# Patient Record
Sex: Female | Born: 1980 | Race: White | Hispanic: No | Smoking: Current some day smoker
Health system: Southern US, Community
[De-identification: ages and names within clinical notes are randomized; demographics above are authoritative.]

## PROBLEM LIST (undated history)

## (undated) DIAGNOSIS — F32A Depression, unspecified: Secondary | ICD-10-CM

## (undated) DIAGNOSIS — G8929 Other chronic pain: Secondary | ICD-10-CM

## (undated) DIAGNOSIS — F329 Major depressive disorder, single episode, unspecified: Secondary | ICD-10-CM

## (undated) DIAGNOSIS — M549 Dorsalgia, unspecified: Secondary | ICD-10-CM

## (undated) DIAGNOSIS — G43909 Migraine, unspecified, not intractable, without status migrainosus: Secondary | ICD-10-CM

## (undated) HISTORY — PX: TUBAL LIGATION: SHX77

## (undated) HISTORY — PX: OTHER SURGICAL HISTORY: SHX169

---

## 2005-08-13 ENCOUNTER — Emergency Department (HOSPITAL_COMMUNITY): Admission: EM | Admit: 2005-08-13 | Discharge: 2005-08-13 | Payer: Self-pay | Admitting: Emergency Medicine

## 2005-09-08 ENCOUNTER — Emergency Department (HOSPITAL_COMMUNITY): Admission: EM | Admit: 2005-09-08 | Discharge: 2005-09-08 | Payer: Self-pay | Admitting: Emergency Medicine

## 2006-10-11 ENCOUNTER — Emergency Department (HOSPITAL_COMMUNITY): Admission: EM | Admit: 2006-10-11 | Discharge: 2006-10-11 | Payer: Self-pay | Admitting: Emergency Medicine

## 2015-12-27 ENCOUNTER — Emergency Department (HOSPITAL_COMMUNITY)
Admission: EM | Admit: 2015-12-27 | Discharge: 2015-12-27 | Disposition: A | Discharge status: Home / Self Care | Payer: BLUE CROSS/BLUE SHIELD | Attending: Emergency Medicine | Admitting: Emergency Medicine

## 2015-12-27 ENCOUNTER — Encounter (HOSPITAL_COMMUNITY): Payer: Self-pay | Admitting: Emergency Medicine

## 2015-12-27 DIAGNOSIS — Z79899 Other long term (current) drug therapy: Secondary | ICD-10-CM | POA: Insufficient documentation

## 2015-12-27 DIAGNOSIS — F17200 Nicotine dependence, unspecified, uncomplicated: Secondary | ICD-10-CM | POA: Diagnosis not present

## 2015-12-27 DIAGNOSIS — T7840XA Allergy, unspecified, initial encounter: Secondary | ICD-10-CM

## 2015-12-27 MED ORDER — PREDNISONE 10 MG PO TABS
10.0000 mg | ORAL_TABLET | Freq: Once | ORAL | Status: AC
  Administered 2015-12-27: 10 mg via ORAL
  Filled 2015-12-27: qty 1

## 2015-12-27 MED ORDER — FAMOTIDINE 20 MG PO TABS
20.0000 mg | ORAL_TABLET | Freq: Once | ORAL | Status: AC
  Administered 2015-12-27: 20 mg via ORAL
  Filled 2015-12-27: qty 1

## 2015-12-27 MED ORDER — HYDROXYZINE HCL 25 MG PO TABS: 25.0000 mg | ORAL_TABLET | Freq: Four times a day (QID) | ORAL | Status: DC | PRN

## 2015-12-27 MED ORDER — EPINEPHRINE 0.3 MG/0.3ML IJ SOAJ: 0.3000 mg | Freq: Once | INTRAMUSCULAR | Status: DC | PRN

## 2015-12-27 MED ORDER — PREDNISONE 10 MG (21) PO TBPK: 10.0000 mg | ORAL_TABLET | Freq: Every day | ORAL | Status: DC

## 2015-12-27 NOTE — ED Notes (Signed)
Pt states she was dx with allergic reaction at [facility] on Sunday. States she has been taking old prednisone prescription from home and has been unable to get new prescription filled. Pt states today she feels face and throat are swollen and generalized itching.

## 2015-12-27 NOTE — ED Provider Notes (Signed)
CSN: [id number]     Arrival date & time 12/27/15  1056 History   First MD Initiated Contact with Patient 12/27/15 1118     Chief Complaint  Patient presents with  . Allergic Reaction     (Consider location/radiation/quality/duration/timing/severity/associated sxs/prior Treatment) HPI  Renee Hunter Is a 35 year old female who presents the emergency department, chief complaint of allergic reaction. Patient states that on Sunday, 3 days ago she had an allergic reaction that began as itching on her hands and progressed to her face and neck. She had used a mentholated Aspercreme that she always uses prior to the event. She's never had an allergic reaction to it before. Patient states that she has been taking 40 mg of prednisone daily. Last night before bed. She used to be Aspercreme again. She awoke this morning with swelling to the right side of her lower lip, itching and swelling on the posterior neck and forearms where she had applied the medication. She took 140 mg tablet of prednisone and 25 mg of Benadryl. She is with a friend who drove her here. He states that she did have some wheezing and was tearful prior to arrival. She states that she still feels some chest tightness but denies any wheezing and feels that she is improving after medications prior to arrival. Her itching is decreasing. She denies tongue swelling, throat swelling. 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 History reviewed. No pertinent past medical history. Past Surgical History  Procedure Laterality Date  . Cesarean section    . Tubal ligation    . Wisdom teeth     No family history on file. Social History  Substance Use Topics  . Smoking status: Current Some Day Smoker  . Smokeless tobacco: None  . Alcohol Use: No   OB History    No data available     Review of Systems  Ten systems reviewed and are negative for acute change, except as noted in the HPI.    Allergies  Mushroom  extract complex and Penicillins  Home Medications   Prior to Admission medications   Medication Sig Start Date End Date Taking? Authorizing Provider  diphenhydrAMINE (BENADRYL) 25 mg capsule Take 25 mg by mouth every 6 (six) hours as needed for itching.   Yes Historical Provider, MD  predniSONE (DELTASONE) 20 MG tablet Take 40 mg by mouth 2 (two) times daily with a meal.   Yes Historical Provider, MD   BP 130/95 mmHg  Pulse 103  Temp(Src) 97.7 F (36.5 C) (Oral)  Resp 20  Wt 134.718 kg  SpO2 99%  LMP 11/18/2015 Physical Exam  Constitutional: She is oriented to person, place, and time. She appears well-developed and well-nourished. No distress.  HENT:  Head: Normocephalic and atraumatic.  Swelling on the right lower lip, no sob, lingual or tongue swelling. Oropharynx clear and moist.   Eyes: Conjunctivae are normal. No scleral icterus.  Neck: Normal range of motion.  No stridor  Cardiovascular: Normal rate, regular rhythm and normal heart sounds.  Exam reveals no gallop and no friction rub.   No murmur heard. Pulmonary/Chest: Effort normal and breath sounds normal. No respiratory distress. She has no wheezes.  Abdominal: Soft. Bowel sounds are normal. She exhibits no distension and no mass. There is no tenderness. There is no guarding.  Neurological: She is alert and oriented to person, place, and time.  Skin: Skin is warm and dry. She is not diaphoretic.  Nursing note and vitals  reviewed.   ED Course  Procedures (including critical care time) Labs Review Labs Reviewed - No data to display  Imaging Review No results found. I have personally reviewed and evaluated these images and lab results as part of my medical decision-making.   EKG Interpretation None      MDM   Final diagnoses:  Allergic reaction, initial encounter    Patient re-evaluated prior to dc, is hemodynamically stable, in no respiratory distress, and denies the feeling of throat closing. Pt has been  advised to take OTC benadryl & return to the ED if they have a mod-severe allergic rxn (s/s including throat closing, difficulty breathing, swelling of lips face or tongue).  Given epi pen and prednisone dosepack.Pt is to follow up with their PCP. Pt is agreeable with plan & verbalizes understanding.     Arthor Captain, PA-C 12/27/15 1705  Zadie Rhine, MD 12/28/15 1259

## 2015-12-27 NOTE — Discharge Instructions (Signed)
Epinephrine Injection Epinephrine is a medicine given by injection to temporarily treat an emergency allergic reaction. It is also used to treat severe asthmatic attacks and other lung problems. The medicine helps to enlarge (dilate) the small breathing tubes of the lungs. A life-threatening, sudden allergic reaction that involves the whole body is called anaphylaxis. Because of potential side effects, epinephrine should only be used as directed by your caregiver. RISKS AND COMPLICATIONS Possible side effects of epinephrine injections include:  Chest pain.  Irregular or rapid heartbeat.  Shortness of breath.  Nausea.  Vomiting.  Abdominal pain or cramping.  Sweating.  Dizziness.  Weakness.  Headache.  Nervousness. Report all side effects to your caregiver. HOW TO GIVE AN EPINEPHRINE INJECTION Give the epinephrine injection immediately when symptoms of a severe reaction begin. Inject the medicine into the outer thigh or any available, large muscle. Your caregiver can teach you how to do this. You do not need to remove any clothing. After the injection, call your local emergency services (911 in [place] Even if you improve after the injection, you need to be examined at a hospital emergency department. Epinephrine works quickly, but it also wears off quickly. Delayed reactions can occur. A delayed reaction may be as serious and dangerous as the initial reaction. HOME CARE INSTRUCTIONS  Make sure you and your family know how to give an epinephrine injection.  Use epinephrine injections as directed by your caregiver. Do not use this medicine more often or in larger doses than prescribed.  Always carry your epinephrine injection or anaphylaxis kit with you. This can be lifesaving if you have a severe reaction.  Store the medicine in a cool, dry place. If the medicine becomes discolored or cloudy, dispose of it properly and replace it with new medicine.  Check the expiration date on  your medicine. It may be unsafe to use medicines past their expiration date.  Tell your caregiver about any other medicines you are taking. Some medicines can react badly with epinephrine.  Tell your caregiver about any medical conditions you have, such as diabetes, high blood pressure (hypertension), heart disease, irregular heartbeats, or if you are pregnant. SEEK IMMEDIATE MEDICAL CARE IF:  You have used an epinephrine injection. Call your local emergency services (911 in [place] Even if you improve after the injection, you need to be examined at a hospital emergency department to make sure your allergic reaction is under control. You will also be monitored for adverse effects from the medicine.  You have chest pain.  You have irregular or fast heartbeats.  You have shortness of breath.  You have severe headaches.  You have severe nausea, vomiting, or abdominal cramps.  You have severe pain, swelling, or redness in the area where you gave the injection.   This information is not intended to replace advice given to you by your health care provider. Make sure you discuss any questions you have with your health care provider.   Document Released: 07/19/2000 Document Revised: 10/14/2011 Document Reviewed: 02/08/2015 Elsevier Interactive Patient Education 2016 [place]. Anaphylactic Reaction An anaphylactic reaction is a sudden, severe allergic reaction that involves the whole body. It can be life threatening. A hospital stay is often required. People with asthma, eczema, or hay fever are slightly more likely to have an anaphylactic reaction. CAUSES  An anaphylactic reaction may be caused by anything to which you are allergic. After being exposed to the allergic substance, your immune system becomes sensitized to it. When you are exposed to  that allergic substance again, an allergic reaction can occur. Common causes of an anaphylactic reaction include:  Medicines.  Foods, especially  peanuts, wheat, shellfish, milk, and eggs.  Insect bites or stings.  Blood products.  Chemicals, such as dyes, latex, and contrast material used for imaging tests. SYMPTOMS  When an allergic reaction occurs, the body releases histamine and other substances. These substances cause symptoms such as tightening of the airway. Symptoms often develop within seconds or minutes of exposure. Symptoms may include:  Skin rash or hives.  Itching.  Chest tightness.  Swelling of the eyes, tongue, or lips.  Trouble breathing or swallowing.  Lightheadedness or fainting.  Anxiety or confusion.  Stomach pains, vomiting, or diarrhea.  Nasal congestion.  A fast or irregular heartbeat (palpitations). DIAGNOSIS  Diagnosis is based on your history of recent exposure to allergic substances, your symptoms, and a physical exam. Your caregiver may also perform blood or urine tests to confirm the diagnosis. TREATMENT  Epinephrine medicine is the main treatment for an anaphylactic reaction. Other medicines that may be used for treatment include antihistamines, steroids, and albuterol. In severe cases, fluids and medicine to support blood pressure may be given through an intravenous line (IV). Even if you improve after treatment, you need to be observed to make sure your condition does not get worse. This may require a stay in the hospital. [place] a medical alert bracelet or necklace stating your allergy.  You and your family must learn how to use an anaphylaxis kit or give an epinephrine injection to temporarily treat an emergency allergic reaction. Always carry your epinephrine injection or anaphylaxis kit with you. This can be lifesaving if you have a severe reaction.  Do not drive or perform tasks after treatment until the medicines used to treat your reaction have worn off, or until your caregiver says it is okay.  If you have hives or a rash:  Take medicines as directed by  your caregiver.  You may use an over-the-counter antihistamine (diphenhydramine) as needed.  Apply cold compresses to the skin or take baths in cool water. Avoid hot baths or showers. SEEK MEDICAL CARE IF:   You develop symptoms of an allergic reaction to a new substance. Symptoms may start right away or minutes later.  You develop a rash, hives, or itching.  You develop new symptoms. SEEK IMMEDIATE MEDICAL CARE IF:   You have swelling of the mouth, difficulty breathing, or wheezing.  You have a tight feeling in your chest or throat.  You develop hives, swelling, or itching all over your body.  You develop severe vomiting or diarrhea.  You feel faint or pass out. This is an emergency. Use your epinephrine injection or anaphylaxis kit as you have been instructed. Call your local emergency services (911 in [place] Even if you improve after the injection, you need to be examined at a hospital emergency department. MAKE SURE YOU:   Understand these instructions.  Will watch your condition.  Will get help right away if you are not doing well or get worse.   This information is not intended to replace advice given to you by your health care provider. Make sure you discuss any questions you have with your health care provider.   Document Released: 07/22/2005 Document Revised: 07/27/2013 Document Reviewed: 02/01/2015 Elsevier Interactive Patient Education 2016 [place] Dermatitis Dermatitis is redness, soreness, and swelling (inflammation) of the skin. Contact dermatitis is a reaction to certain substances that touch  the skin. There are two types of contact dermatitis:   Irritant contact dermatitis. This type is caused by something that irritates your skin, such as dry hands from washing them too much. This type does not require previous exposure to the substance for a reaction to occur. This type is more common.  Allergic contact dermatitis. This type is caused by a  substance that you are allergic to, such as a nickel allergy or poison ivy. This type only occurs if you have been exposed to the substance (allergen) before. Upon a repeat exposure, your body reacts to the substance. This type is less common. CAUSES  Many different substances can cause contact dermatitis. Irritant contact dermatitis is most commonly caused by exposure to:   Makeup.   Soaps.   Detergents.   Bleaches.   Acids.   Metal salts, such as nickel.  Allergic contact dermatitis is most commonly caused by exposure to:   Poisonous plants.   Chemicals.   Jewelry.   Latex.   Medicines.   Preservatives in products, such as clothing.  RISK FACTORS This condition is more likely to develop in:   People who have jobs that expose them to irritants or allergens.  People who have certain medical conditions, such as asthma or eczema.  SYMPTOMS  Symptoms of this condition may occur anywhere on your body where the irritant has touched you or is touched by you. Symptoms include:  Dryness or flaking.   Redness.   Cracks.   Itching.   Pain or a burning feeling.   Blisters.  Drainage of small amounts of blood or clear fluid from skin cracks. With allergic contact dermatitis, there may also be swelling in areas such as the eyelids, mouth, or genitals.  DIAGNOSIS  This condition is diagnosed with a medical history and physical exam. A patch skin test may be performed to help determine the cause. If the condition is related to your job, you may need to see an occupational medicine specialist. TREATMENT Treatment for this condition includes figuring out what caused the reaction and protecting your skin from further contact. Treatment may also include:   Steroid creams or ointments. Oral steroid medicines may be needed in more severe cases.  Antibiotics or antibacterial ointments, if a skin infection is present.  Antihistamine lotion or an antihistamine  taken by mouth to ease itching.  A bandage (dressing). HOME CARE INSTRUCTIONS Skin Care  Moisturize your skin as needed.   Apply cool compresses to the affected areas.  Try taking a bath with:  Epsom salts. Follow the instructions on the packaging. You can get these at your local pharmacy or grocery store.  Baking soda. Pour a small amount into the bath as directed by your health care provider.  Colloidal oatmeal. Follow the instructions on the packaging. You can get this at your local pharmacy or grocery store.  Try applying baking soda paste to your skin. Stir water into baking soda until it reaches a paste-like consistency.  Do not scratch your skin.  Bathe less frequently, such as every other day.  Bathe in lukewarm water. Avoid using hot water. Medicines  Take or apply over-the-counter and prescription medicines only as told by your health care provider.   If you were prescribed an antibiotic medicine, take or apply your antibiotic as told by your health care provider. Do not stop using the antibiotic even if your condition starts to improve. General Instructions  Keep all follow-up visits as told by your health care  provider. This is important.  Avoid the substance that caused your reaction. If you do not know what caused it, keep a journal to try to track what caused it. Write down:  What you eat.  What cosmetic products you use.  What you drink.  What you wear in the affected area. This includes jewelry.  If you were given a dressing, take care of it as told by your health care provider. This includes when to change and remove it. SEEK MEDICAL CARE IF:   Your condition does not improve with treatment.  Your condition gets worse.  You have signs of infection such as swelling, tenderness, redness, soreness, or warmth in the affected area.  You have a fever.  You have new symptoms. SEEK IMMEDIATE MEDICAL CARE IF:   You have a severe headache, neck  pain, or neck stiffness.  You vomit.  You feel very sleepy.  You notice red streaks coming from the affected area.  Your bone or joint underneath the affected area becomes painful after the skin has healed.  The affected area turns darker.  You have difficulty breathing.   This information is not intended to replace advice given to you by your health care provider. Make sure you discuss any questions you have with your health care provider.   Document Released: 07/19/2000 Document Revised: 04/12/2015 Document Reviewed: 12/07/2014 Elsevier Interactive Patient Education [employer].

## 2016-12-01 ENCOUNTER — Encounter (HOSPITAL_COMMUNITY): Payer: Self-pay | Admitting: Emergency Medicine

## 2016-12-01 ENCOUNTER — Emergency Department (HOSPITAL_COMMUNITY)
Admission: EM | Admit: 2016-12-01 | Discharge: 2016-12-01 | Disposition: A | Discharge status: Home / Self Care | Payer: BLUE CROSS/BLUE SHIELD | Attending: Emergency Medicine | Admitting: Emergency Medicine

## 2016-12-01 DIAGNOSIS — R112 Nausea with vomiting, unspecified: Secondary | ICD-10-CM | POA: Insufficient documentation

## 2016-12-01 DIAGNOSIS — Z79899 Other long term (current) drug therapy: Secondary | ICD-10-CM | POA: Diagnosis not present

## 2016-12-01 DIAGNOSIS — R51 Headache: Secondary | ICD-10-CM | POA: Insufficient documentation

## 2016-12-01 DIAGNOSIS — Z7982 Long term (current) use of aspirin: Secondary | ICD-10-CM | POA: Diagnosis not present

## 2016-12-01 DIAGNOSIS — F17200 Nicotine dependence, unspecified, uncomplicated: Secondary | ICD-10-CM | POA: Diagnosis not present

## 2016-12-01 DIAGNOSIS — R519 Headache, unspecified: Secondary | ICD-10-CM

## 2016-12-01 HISTORY — DX: Migraine, unspecified, not intractable, without status migrainosus: G43.909

## 2016-12-01 HISTORY — DX: Other chronic pain: G89.29

## 2016-12-01 HISTORY — DX: Dorsalgia, unspecified: M54.9

## 2016-12-01 MED ORDER — SODIUM CHLORIDE 0.9 % IV BOLUS (SEPSIS)
500.0000 mL | Freq: Once | INTRAVENOUS | Status: AC
  Administered 2016-12-01: 500 mL via INTRAVENOUS

## 2016-12-01 MED ORDER — PROMETHAZINE HCL 25 MG/ML IJ SOLN
25.0000 mg | Freq: Once | INTRAMUSCULAR | Status: AC
  Administered 2016-12-01: 25 mg via INTRAVENOUS
  Filled 2016-12-01: qty 1

## 2016-12-01 MED ORDER — KETOROLAC TROMETHAMINE 30 MG/ML IJ SOLN
30.0000 mg | Freq: Once | INTRAMUSCULAR | Status: AC
  Administered 2016-12-01: 30 mg via INTRAVENOUS
  Filled 2016-12-01: qty 1

## 2016-12-01 NOTE — ED Triage Notes (Signed)
Head and neck pain x 1230 today. Hx of migraines

## 2016-12-01 NOTE — ED Provider Notes (Signed)
[facility]PT Provider Note   CSN: [id number] Arrival date & time: 12/01/16  1931     History   Chief Complaint Chief Complaint  Patient presents with  . Migraine    HPI Renee Hunter is a 36 y.o. female.  HPI Patient presents with headache. Began around 12:30 today. It is dull and throbbing. It is in the back of her head and also goes to the front of her head. Comes up her neck. Like previous migraines that she's had. States this is just a little more severe. No trauma. No numbness weakness. States the light bothers her. States the sound is also tough. States the bumps in the road hurt her also. No numbness weakness. No confusion. No fevers. States she does have some mild pain in her right ear. Denies possibly pregnancy since she is on her period and has had her tubes tied.   Past Medical History:  Diagnosis Date  . Chronic back pain   . Migraines     There are no active problems to display for this patient.   Past Surgical History:  Procedure Laterality Date  . CESAREAN SECTION    . TUBAL LIGATION    . wisdom teeth      OB History    No data available       Home Medications    Prior to Admission medications   Medication Sig Start Date End Date Taking? Authorizing Provider  acetaminophen (TYLENOL) 500 MG tablet Take 1,000 mg by mouth every 6 (six) hours as needed for mild pain, moderate pain, fever or headache.   Yes Historical Provider, MD  aspirin-acetaminophen-caffeine (EXCEDRIN MIGRAINE) [phone] MG tablet Take 1 tablet by mouth every 6 (six) hours as needed for headache.   Yes Historical Provider, MD  ibuprofen (ADVIL,MOTRIN) 200 MG tablet Take 400 mg by mouth every 6 (six) hours as needed for headache, mild pain or moderate pain.   Yes Historical Provider, MD    Family History No family history on file.  Social History Social History  Substance Use Topics  . Smoking status: Current Some Day Smoker  . Smokeless tobacco: Never Used  .  Alcohol use No     Allergies   Mushroom extract complex and Penicillins   Review of Systems Review of Systems  Constitutional: Positive for appetite change. Negative for fever.  HENT: Negative for congestion.   Eyes: Positive for photophobia.  Respiratory: Negative for shortness of breath.   Cardiovascular: Negative for chest pain.  Gastrointestinal: Positive for nausea and vomiting. Negative for abdominal pain.  Genitourinary: Negative for enuresis and flank pain.  Musculoskeletal: Negative for back pain.  Skin: Negative for wound.  Neurological: Negative for tremors, syncope and light-headedness.     Physical Exam Updated Vital Signs BP 97/61   Pulse 68   Temp 97.7 F (36.5 C) (Oral)   Resp 18   Ht  (1.803 m)   Wt 284 lb (128.8 kg)   LMP 12/01/2016   SpO2 93%   BMI 39.61 kg/m   Physical Exam  Constitutional: She appears well-developed and well-nourished.  HENT:  Head: Atraumatic.  Left TM normal. Slight bulging of right TM without erythema or effusion.  Eyes: Pupils are equal, round, and reactive to light.  Neck: Neck supple.  Cardiovascular: Normal rate.   Pulmonary/Chest: Effort normal.  Abdominal: Soft. There is no tenderness.  Musculoskeletal: She exhibits no edema.  Neurological: She is alert.  Skin: Skin is warm. Capillary refill takes less than  2 seconds.  Psychiatric: She has a normal mood and affect.     ED Treatments / Results  Labs (all labs ordered are listed, but only abnormal results are displayed) Labs Reviewed - No data to display  EKG  EKG Interpretation None       Radiology No results found.  Procedures Procedures (including critical care time)  Medications Ordered in ED Medications  promethazine (PHENERGAN) injection 25 mg (25 mg Intravenous Given 12/01/16 2026)  ketorolac (TORADOL) 30 MG/ML injection 30 mg (30 mg Intravenous Given 12/01/16 2026)  sodium chloride 0.9 % bolus 500 mL (0 mLs Intravenous Stopped 12/01/16  2120)     Initial Impression / Assessment and Plan / ED Course  I have reviewed the triage vital signs and the nursing notes.  Pertinent labs & imaging results that were available during my care of the patient were reviewed by me and considered in my medical decision making (see chart for details).     Patient with headache. Feels somewhat better after treatment. Does however have some neck pain. Doubt this is meningitis. Will discharge home.   Final Clinical Impressions(s) / ED Diagnoses   Final diagnoses:  Acute nonintractable headache, unspecified headache type    New Prescriptions New Prescriptions   No medications on file     Benjiman Core, MD 12/01/16 2120

## 2017-05-04 ENCOUNTER — Encounter (HOSPITAL_COMMUNITY): Payer: Self-pay

## 2017-05-04 ENCOUNTER — Emergency Department (HOSPITAL_COMMUNITY)
Admission: EM | Admit: 2017-05-04 | Discharge: 2017-05-04 | Disposition: A | Discharge status: Home / Self Care | Payer: BLUE CROSS/BLUE SHIELD | Attending: Emergency Medicine | Admitting: Emergency Medicine

## 2017-05-04 DIAGNOSIS — J029 Acute pharyngitis, unspecified: Secondary | ICD-10-CM

## 2017-05-04 DIAGNOSIS — J039 Acute tonsillitis, unspecified: Secondary | ICD-10-CM

## 2017-05-04 DIAGNOSIS — F17200 Nicotine dependence, unspecified, uncomplicated: Secondary | ICD-10-CM | POA: Diagnosis not present

## 2017-05-04 LAB — RAPID STREP SCREEN (MED CTR MEBANE ONLY): Streptococcus, Group A Screen (Direct): NEGATIVE

## 2017-05-04 MED ORDER — DEXAMETHASONE 10 MG/ML FOR PEDIATRIC ORAL USE
16.0000 mg | Freq: Once | INTRAMUSCULAR | Status: AC
  Administered 2017-05-04: 16 mg via ORAL
  Filled 2017-05-04: qty 2

## 2017-05-04 MED ORDER — CLINDAMYCIN HCL 150 MG PO CAPS: 300.0000 mg | ORAL_CAPSULE | Freq: Three times a day (TID) | ORAL | 0 refills | Status: AC

## 2017-05-04 MED ORDER — CLINDAMYCIN HCL 150 MG PO CAPS
300.0000 mg | ORAL_CAPSULE | Freq: Once | ORAL | Status: AC
  Administered 2017-05-04: 300 mg via ORAL
  Filled 2017-05-04: qty 2

## 2017-05-04 NOTE — ED Provider Notes (Signed)
[facility]-EMERGENCY DEPT Provider Note   CSN: [id number] Arrival date & time: 05/04/17  1226     History   Chief Complaint Chief Complaint  Patient presents with  . Sore Throat  . Otalgia    HPI Renee Hunter is a 36 y.o. female presenting with gradual onset sore throats over the last 3 days with progressive worsening. She reports that she has severe allergies and thought that this was due to postnasal drip. She reports exacerbation by eating and warm beverages. As experience relief with frozen items like ice cream. She has tried Tylenol severe cold and ibuprofen without relief. Last night the pain was so extreme that she took one of her husband's Percocet which provided her with some relief. She also reports bilateral ear discomfort worse on the left. She denies any fever, chills, cough.  HPI  Past Medical History:  Diagnosis Date  . Chronic back pain   . Migraines     There are no active problems to display for this patient.   Past Surgical History:  Procedure Laterality Date  . CESAREAN SECTION    . TUBAL LIGATION    . wisdom teeth      OB History    No data available       Home Medications    Prior to Admission medications   Medication Sig Start Date End Date Taking? Authorizing Provider  acetaminophen (TYLENOL) 500 MG tablet Take 1,000 mg by mouth every 6 (six) hours as needed for mild pain, moderate pain, fever or headache.   Yes [provider]  aspirin-acetaminophen-caffeine (EXCEDRIN MIGRAINE) [phone] MG tablet Take 1 tablet by mouth every 6 (six) hours as needed for headache.   Yes [provider]  ibuprofen (ADVIL,MOTRIN) 200 MG tablet Take 400 mg by mouth every 6 (six) hours as needed for headache, mild pain or moderate pain.   Yes [provider]  clindamycin (CLEOCIN) 150 MG capsule Take 2 capsules (300 mg total) by mouth 3 (three) times daily. 05/04/17 05/14/17  Georgiana Shore, PA-C    Family History No family  history on file.  Social History Social History  Substance Use Topics  . Smoking status: Current Some Day Smoker  . Smokeless tobacco: Never Used  . Alcohol use No     Allergies   Mushroom extract complex and Penicillins   Review of Systems Review of Systems  Constitutional: Negative for chills and fever.  HENT: Positive for congestion, ear pain, postnasal drip and sore throat. Negative for drooling, ear discharge, sinus pain, sinus pressure, tinnitus and trouble swallowing.   Respiratory: Negative for cough, shortness of breath, wheezing and stridor.   Cardiovascular: Negative for chest pain and palpitations.  Gastrointestinal: Negative for abdominal pain, nausea and vomiting.  Musculoskeletal: Negative for arthralgias, back pain, neck pain and neck stiffness.  Skin: Negative for color change, pallor, rash and wound.  Neurological: Negative for dizziness, seizures, syncope and headaches.     Physical Exam Updated Vital Signs BP 128/86   Pulse 79   Temp 98 F (36.7 C) (Oral)   Resp 18   Ht  (1.778 m)   Wt 120.2 kg (265 lb)   LMP 03/06/2017 Comment: irregular  SpO2 99%   BMI 38.02 kg/m   Physical Exam  Constitutional: She appears well-developed and well-nourished. No distress.  Afebrile, nontoxic-appearing, sitting comfortably in bed in no acute distress.  HENT:  Head: Normocephalic and atraumatic.  [id number]: External ear normal.  Left Ear: External ear  normal.  Mouth/Throat: Uvula is midline and mucous membranes are normal. No trismus in the jaw. No uvula swelling. Oropharyngeal exudate and posterior oropharyngeal erythema present. No posterior oropharyngeal edema or tonsillar abscesses. Tonsils are 3+ on the right. Tonsils are 3+ on the left. Tonsillar exudate.  Tympanic membranes are normal bilaterally.  Eyes: Conjunctivae and EOM are normal. Right eye exhibits no discharge. Left eye exhibits no discharge.  Neck: Normal range of motion. Neck supple.    Cardiovascular: Normal rate, regular rhythm and normal heart sounds.   No murmur heard. Pulmonary/Chest: Effort normal and breath sounds normal. No stridor. No respiratory distress. She has no wheezes. She has no rales.  Abdominal: She exhibits no distension.  Musculoskeletal: She exhibits no edema.  Lymphadenopathy:    She has cervical adenopathy.  Neurological: She is alert.  Skin: Skin is warm and dry. No rash noted. She is not diaphoretic. No erythema. No pallor.  Psychiatric: She has a normal mood and affect.  Nursing note and vitals reviewed.    ED Treatments / Results  Labs (all labs ordered are listed, but only abnormal results are displayed) Labs Reviewed  RAPID STREP SCREEN (NOT AT [facility])  CULTURE, GROUP A STREP [facility])    EKG  EKG Interpretation None       Radiology No results found.  Procedures Procedures (including critical care time)  Medications Ordered in ED Medications  clindamycin (CLEOCIN) capsule 300 mg (300 mg Oral Given 05/04/17 1430)  dexamethasone (DECADRON) 10 MG/ML injection for Pediatric ORAL use 16 mg (16 mg Oral Given 05/04/17 1430)     Initial Impression / Assessment and Plan / ED Course  I have reviewed the triage vital signs and the nursing notes.  Pertinent labs & imaging results that were available during my care of the patient were reviewed by me and considered in my medical decision making (see chart for details).    Patient presents with tonsillitis 3+ with exudate and dysphagia. Afebrile, nontoxic appearing,tolerating oral secretions well. No evidence of peritonsillar abscess.  Patient given Decadron while in the emergency department and first dose of clindamycin.   Rapid Strep negative  Discharge home with symptomatic relief, antibiotics and close follow-up with PCP.  Discussed strict return precautions and advised to return to the emergency department if experiencing any new or worsening symptoms. Instructions were  understood and patient agreed with discharge plan.  Final Clinical Impressions(s) / ED Diagnoses   Final diagnoses:  Tonsillitis  Pharyngitis, unspecified etiology    New Prescriptions Discharge Medication List as of 05/04/2017  2:28 PM    START taking these medications   Details  clindamycin (CLEOCIN) 150 MG capsule Take 2 capsules (300 mg total) by mouth 3 (three) times daily., Starting Sun 05/04/2017, Until Wed 05/14/2017, Print         Mathews Robinsons B, PA-C 05/04/17 Eppie Gibson, MD 05/06/17 [phone]

## 2017-05-04 NOTE — ED Triage Notes (Signed)
Reports of sore throat and bilateral ear pain since Thursday. Denies fever.

## 2017-05-04 NOTE — Discharge Instructions (Signed)
As discussed, take course of antibiotics even if you feel better. Stay well-hydrated and get some rest. Popscicles or ice cream to soothe your throat. Follow up with your primary care provider.  Return to the emergency department of facial swelling, difficulty breathing, difficulty swallowing, worsening symptoms or any new concerning symptoms in the meantime.

## 2017-05-04 NOTE — ED Notes (Signed)
ED Provider at bedside. 

## 2017-05-07 LAB — CULTURE, GROUP A STREP (THRC)

## 2017-07-18 ENCOUNTER — Emergency Department (HOSPITAL_COMMUNITY)
Admission: EM | Admit: 2017-07-18 | Discharge: 2017-07-18 | Disposition: A | Discharge status: Home / Self Care | Payer: BLUE CROSS/BLUE SHIELD | Attending: Emergency Medicine | Admitting: Emergency Medicine

## 2017-07-18 ENCOUNTER — Encounter (HOSPITAL_COMMUNITY): Payer: Self-pay | Admitting: Emergency Medicine

## 2017-07-18 DIAGNOSIS — F17200 Nicotine dependence, unspecified, uncomplicated: Secondary | ICD-10-CM | POA: Insufficient documentation

## 2017-07-18 DIAGNOSIS — R309 Painful micturition, unspecified: Secondary | ICD-10-CM | POA: Diagnosis present

## 2017-07-18 DIAGNOSIS — N390 Urinary tract infection, site not specified: Secondary | ICD-10-CM | POA: Diagnosis not present

## 2017-07-18 LAB — URINALYSIS, ROUTINE W REFLEX MICROSCOPIC
Bilirubin Urine: NEGATIVE
GLUCOSE, UA: NEGATIVE mg/dL
HGB URINE DIPSTICK: NEGATIVE
KETONES UR: NEGATIVE mg/dL
Nitrite: POSITIVE — AB
PROTEIN: NEGATIVE mg/dL
Specific Gravity, Urine: 1.017 (ref 1.005–1.030)
pH: 5 (ref 5.0–8.0)

## 2017-07-18 LAB — PREGNANCY, URINE: Preg Test, Ur: NEGATIVE

## 2017-07-18 MED ORDER — CEPHALEXIN 500 MG PO CAPS: 500.0000 mg | ORAL_CAPSULE | Freq: Four times a day (QID) | ORAL | 0 refills | Status: DC

## 2017-07-18 MED ORDER — CEPHALEXIN 500 MG PO CAPS
500.0000 mg | ORAL_CAPSULE | Freq: Once | ORAL | Status: AC
  Administered 2017-07-18: 500 mg via ORAL
  Filled 2017-07-18: qty 1

## 2017-07-18 NOTE — ED Provider Notes (Signed)
[facility]ENC[facility]PART[facility] Provider Note   CSN: [id number] Arrival date & time: 07/18/17  0251     History   Chief Complaint Chief Complaint  Patient presents with  . Back Pain    HPI Renee Hunter is a 36 y.o. female.  Patient here with her son who is also a patient.  She reports that she thinks and she has a urinary tract infection because her urine has had a strong odor she is been going frequently having discomfort when she urinates.  Denies any blood in the urine.  Denies any fever or vomiting.  Denies any abdominal pain.  Patient states she has chronic neck pain for the past 1 year and is seeing a chiropractor who told her her "bones are out of alignment".  She denies any new change to this pain.  No trauma.  No focal weakness, numbness or tingling.   The history is provided by the patient.    Past Medical History:  Diagnosis Date  . Chronic back pain   . Migraines     There are no active problems to display for this patient.   Past Surgical History:  Procedure Laterality Date  . CESAREAN SECTION    . TUBAL LIGATION    . wisdom teeth      OB History    No data available       Home Medications    Prior to Admission medications   Medication Sig Start Date End Date Taking? Authorizing Provider  acetaminophen (TYLENOL) 500 MG tablet Take 1,000 mg by mouth every 6 (six) hours as needed for mild pain, moderate pain, fever or headache.   Yes [provider]  aspirin-acetaminophen-caffeine (EXCEDRIN MIGRAINE) [id number] MG tablet Take 1 tablet by mouth every 6 (six) hours as needed for headache.   Yes [provider]  ibuprofen (ADVIL,MOTRIN) 200 MG tablet Take 400 mg by mouth every 6 (six) hours as needed for headache, mild pain or moderate pain.   Yes [provider]    Family History No family history on file.  Social History Social History   Tobacco Use  . Smoking status: Current Some Day Smoker  . Smokeless  tobacco: Never Used  Substance Use Topics  . Alcohol use: No  . Drug use: No     Allergies   Mushroom extract complex and Penicillins   Review of Systems Review of Systems  Constitutional: Negative for activity change, appetite change and fever.  Respiratory: Negative for cough, chest tightness and shortness of breath.   Cardiovascular: Negative for chest pain and leg swelling.  Gastrointestinal: Negative for abdominal pain, nausea and vomiting.  Genitourinary: Positive for dysuria, frequency and urgency. Negative for flank pain, vaginal bleeding and vaginal discharge.  Musculoskeletal: Positive for arthralgias, back pain, myalgias and neck pain.  Neurological: Negative for dizziness, weakness and light-headedness.   all other systems are negative except as noted in the HPI and PMH.     Physical Exam Updated Vital Signs BP 121/65 (BP Location: Left Arm)   Pulse 98   Temp 97.9 F (36.6 C) (Oral)   Resp 20   LMP 07/29/2016 (Approximate)   SpO2 98%   Physical Exam  Constitutional: She is oriented to person, place, and time. She appears well-developed and well-nourished. No distress.  Well appearing  HENT:  Head: Normocephalic and atraumatic.  Mouth/Throat: Oropharynx is clear and moist. No oropharyngeal exudate.  Eyes: Conjunctivae and EOM are normal. Pupils are equal, round, and reactive to  light.  Neck: Normal range of motion. Neck supple.  Paraspinal cervical tenderness bilaterally, no midline tenderness, moving head back and forth during conversation without difficulty  Cardiovascular: Normal rate, regular rhythm, normal heart sounds and intact distal pulses.  No murmur heard. Pulmonary/Chest: Effort normal and breath sounds normal. No respiratory distress.  Abdominal: Soft. There is no tenderness. There is no rebound and no guarding.  Musculoskeletal: Normal range of motion. She exhibits no edema or tenderness.  No CVA tenderness  Neurological: She is alert and  oriented to person, place, and time. No cranial nerve deficit. She exhibits normal muscle tone. Coordination normal.  No ataxia on finger to nose bilaterally. No pronator drift. 5/5 strength throughout. CN 2-12 intact.Equal grip strength. Sensation intact.   Skin: Skin is warm.  Psychiatric: She has a normal mood and affect. Her behavior is normal.  Nursing note and vitals reviewed.    ED Treatments / Results  Labs (all labs ordered are listed, but only abnormal results are displayed) Labs Reviewed  URINALYSIS, ROUTINE W REFLEX MICROSCOPIC - Abnormal; Notable for the following components:      Result Value   APPearance HAZY (*)    Nitrite POSITIVE (*)    Leukocytes, UA MODERATE (*)    Bacteria, UA FEW (*)    Squamous Epithelial / LPF 0-5 (*)    All other components within normal limits  PREGNANCY, URINE    EKG  EKG Interpretation None       Radiology No results found.  Procedures Procedures (including critical care time)  Medications Ordered in ED Medications - No data to display   Initial Impression / Assessment and Plan / ED Course  I have reviewed the triage vital signs and the nursing notes.  Pertinent labs & imaging results that were available during my care of the patient were reviewed by me and considered in my medical decision making (see chart for details).    Presents with several days of urinary frequency, urgency and odor to her urine.  No abdominal pain, back pain or fever.  Patient appears well. No neuro deficits.   Urinalysis consistent with infection.  Will treat.  Patient is penicillin allergic but states she has tolerated Keflex in the past.  Patient be treated for UTI.  Her neck pain is been ongoing for over a year.  She has no focal neurological deficits.  Low suspicion for acute spinal cord pathology.  Follow-up with PCP.  Return precautions discussed. Final Clinical Impressions(s) / ED Diagnoses   Final diagnoses:  Urinary tract infection  without hematuria, site unspecified    ED Discharge Orders    None       Theodore Rahrig, Jeannett SeniorStephen, MD 07/18/17 [phone]

## 2017-07-18 NOTE — ED Triage Notes (Signed)
Pt C/O back pain with urinary frequency. Pt states the urine has a strong odor with no discharge.

## 2017-07-18 NOTE — Discharge Instructions (Signed)
Take the antibiotic as prescribed.  Follow-up with your doctor.  Return to the ED if you develop new or worsening symptoms.

## 2017-07-18 NOTE — ED Notes (Signed)
Pt ambulatory to waiting room. Pt verbalized understanding of discharge instructions.   

## 2017-11-11 ENCOUNTER — Emergency Department (HOSPITAL_COMMUNITY)
Admission: EM | Admit: 2017-11-11 | Discharge: 2017-11-11 | Discharge status: Against Medical Advice | Payer: Medicaid - Out of State

## 2017-11-11 NOTE — ED Notes (Signed)
No answer in waiting room X1,  

## 2017-11-11 NOTE — ED Notes (Signed)
No answer in waiting room 

## 2017-12-17 ENCOUNTER — Emergency Department (HOSPITAL_COMMUNITY)
Admission: EM | Admit: 2017-12-17 | Discharge: 2017-12-18 | Disposition: A | Discharge status: Home / Self Care | Payer: Medicaid - Out of State | Attending: Emergency Medicine | Admitting: Emergency Medicine

## 2017-12-17 DIAGNOSIS — S99919A Unspecified injury of unspecified ankle, initial encounter: Secondary | ICD-10-CM

## 2017-12-17 DIAGNOSIS — M542 Cervicalgia: Secondary | ICD-10-CM | POA: Insufficient documentation

## 2017-12-17 DIAGNOSIS — M549 Dorsalgia, unspecified: Secondary | ICD-10-CM | POA: Diagnosis not present

## 2017-12-17 DIAGNOSIS — Z79899 Other long term (current) drug therapy: Secondary | ICD-10-CM | POA: Insufficient documentation

## 2017-12-17 DIAGNOSIS — W19XXXA Unspecified fall, initial encounter: Secondary | ICD-10-CM

## 2017-12-17 DIAGNOSIS — M79671 Pain in right foot: Secondary | ICD-10-CM | POA: Insufficient documentation

## 2017-12-17 DIAGNOSIS — M25571 Pain in right ankle and joints of right foot: Secondary | ICD-10-CM | POA: Diagnosis not present

## 2017-12-17 DIAGNOSIS — F17200 Nicotine dependence, unspecified, uncomplicated: Secondary | ICD-10-CM | POA: Diagnosis not present

## 2017-12-17 DIAGNOSIS — Y9289 Other specified places as the place of occurrence of the external cause: Secondary | ICD-10-CM | POA: Diagnosis not present

## 2017-12-17 DIAGNOSIS — Y9389 Activity, other specified: Secondary | ICD-10-CM | POA: Diagnosis not present

## 2017-12-17 DIAGNOSIS — S80812A Abrasion, left lower leg, initial encounter: Secondary | ICD-10-CM | POA: Diagnosis not present

## 2017-12-17 DIAGNOSIS — T148XXA Other injury of unspecified body region, initial encounter: Secondary | ICD-10-CM

## 2017-12-17 DIAGNOSIS — S80811A Abrasion, right lower leg, initial encounter: Secondary | ICD-10-CM | POA: Diagnosis not present

## 2017-12-17 DIAGNOSIS — M25572 Pain in left ankle and joints of left foot: Secondary | ICD-10-CM | POA: Insufficient documentation

## 2017-12-17 DIAGNOSIS — Y999 Unspecified external cause status: Secondary | ICD-10-CM | POA: Insufficient documentation

## 2017-12-17 DIAGNOSIS — R202 Paresthesia of skin: Secondary | ICD-10-CM | POA: Insufficient documentation

## 2017-12-17 DIAGNOSIS — S8991XA Unspecified injury of right lower leg, initial encounter: Secondary | ICD-10-CM | POA: Diagnosis present

## 2017-12-17 NOTE — ED Triage Notes (Signed)
Pt states that she was getting into her truck when both of her legs went numb causing her to fall, pt states that she has problems with her legs going numb, c/o bilateral lower leg pain,

## 2017-12-18 ENCOUNTER — Other Ambulatory Visit: Payer: Self-pay

## 2017-12-18 ENCOUNTER — Encounter (HOSPITAL_COMMUNITY): Payer: Self-pay | Admitting: *Deleted

## 2017-12-18 ENCOUNTER — Emergency Department (HOSPITAL_COMMUNITY): Payer: Medicaid - Out of State

## 2017-12-18 MED ORDER — METHOCARBAMOL 500 MG PO TABS: 500.0000 mg | ORAL_TABLET | Freq: Every evening | ORAL | 0 refills | Status: DC | PRN

## 2017-12-18 MED ORDER — PREDNISONE 50 MG PO TABS
60.0000 mg | ORAL_TABLET | Freq: Once | ORAL | Status: AC
  Administered 2017-12-18: 60 mg via ORAL
  Filled 2017-12-18: qty 1

## 2017-12-18 MED ORDER — HYDROCODONE-ACETAMINOPHEN 5-325 MG PO TABS
1.0000 | ORAL_TABLET | Freq: Once | ORAL | Status: AC
  Administered 2017-12-18: 1 via ORAL
  Filled 2017-12-18: qty 1

## 2017-12-18 MED ORDER — PREDNISONE 10 MG PO TABS: 40.0000 mg | ORAL_TABLET | Freq: Every day | ORAL | 0 refills | Status: AC

## 2017-12-18 NOTE — ED Notes (Signed)
Patient transported to X-ray 

## 2017-12-18 NOTE — Discharge Instructions (Addendum)
As discussed, follow the rice protocol outlined in these instructions.  Take prednisone for the next 4 days starting tomorrow and muscle relaxer at nighttime or every 8 hours as needed.  Make sure that you do not drive or operate machinery while on this medication.  Start ibuprofen 800 every 8 hours after you have completed your course of prednisone. Follow up with your primary care provider and neurosurgery. Return if symptoms worsen, loss of bowel or bladder function, numbness, weakness, fever, chills, or other new concerning symptoms in the meantime.

## 2017-12-18 NOTE — ED Provider Notes (Signed)
[facility] EMERGENCY DEPARTMENT Provider Note   CSN: [id number] Arrival date & time: 12/17/17  2352     History   Chief Complaint Chief Complaint  Patient presents with  . Fall    HPI BRANDEY Hunter is a 37 y.o. female with past medical history significant for chronic back pain and disc herniation presenting with fall while getting into her truck prior to arrival.  Reports that she felt the severe back pain and bilateral numbness which caused her to fall.  He denies any head trauma or loss of consciousness.  She is experiencing pain to the bilateral ankles.  Her pain is aggravated by weightbearing onto the right foot.  She has taken 800 mg ibuprofen without relief prior to arrival.  She explains that she has had these symptoms ongoing for the past year.  She was seen by her PCP who has recommended to follow-up with neurosurgery in the past but she refused to follow-up as she was afraid of surgery.  She states that her back pain and bilateral numbness are chronic and she is not experiencing any new symptoms such as loss of bowel or bladder function, fever or other symptoms.  HPI  Past Medical History:  Diagnosis Date  . Chronic back pain   . Migraines     There are no active problems to display for this patient.   Past Surgical History:  Procedure Laterality Date  . CESAREAN SECTION    . TUBAL LIGATION    . wisdom teeth       OB History   None      Home Medications    Prior to Admission medications   Medication Sig Start Date End Date Taking? Authorizing Provider  acetaminophen (TYLENOL) 500 MG tablet Take 1,000 mg by mouth every 6 (six) hours as needed for mild pain, moderate pain, fever or headache.   Yes [provider]  aspirin-acetaminophen-caffeine (EXCEDRIN MIGRAINE) [phone] MG tablet Take 1 tablet by mouth every 6 (six) hours as needed for headache.   Yes [provider]  clonazePAM (KLONOPIN) 0.5 MG tablet Take 0.5 mg by mouth as needed  for anxiety.   Yes [provider]  escitalopram (LEXAPRO) 20 MG tablet Take 20 mg by mouth daily.   Yes [provider]  ibuprofen (ADVIL,MOTRIN) 200 MG tablet Take 400 mg by mouth every 6 (six) hours as needed for headache, mild pain or moderate pain.   Yes [provider]  cephALEXin (KEFLEX) 500 MG capsule Take 1 capsule (500 mg total) by mouth 4 (four) times daily. 07/18/17   Rancour, Jeannett Senior, MD  clonazePAM Scarlette Calico) 0.5 MG tablet  10/20/17   [provider]  escitalopram (LEXAPRO) 20 MG tablet  10/22/17   [provider]  methocarbamol (ROBAXIN) 500 MG tablet Take 1 tablet (500 mg total) by mouth at bedtime as needed. 12/18/17   Georgiana Shore, PA-C  predniSONE (DELTASONE) 10 MG tablet Take 4 tablets (40 mg total) by mouth daily for 4 days. 12/18/17 12/22/17  Georgiana Shore, PA-C    Family History No family history on file.  Social History Social History   Tobacco Use  . Smoking status: Current Some Day Smoker  . Smokeless tobacco: Never Used  Substance Use Topics  . Alcohol use: No  . Drug use: No     Allergies   Mushroom extract complex and Penicillins   Review of Systems Review of Systems  Constitutional: Negative for chills, diaphoresis, fatigue and fever.  Respiratory: Negative for choking, chest tightness and shortness of breath.   Cardiovascular: Negative for chest pain, palpitations and leg swelling.  Gastrointestinal: Negative for abdominal pain, nausea and vomiting.  Musculoskeletal: Positive for arthralgias, back pain, joint swelling and neck pain.       Patient reports chronic back pain and neck pain.  No new injuries.  Acute onset bilateral ankle pain after the fall.  Skin: Positive for wound. Negative for color change, pallor and rash.       Bilateral superficial abrasion to the lower extremities.  Abrasion to the right distal lower extremity near the ankle.  Neurological: Positive for numbness. Negative  for dizziness, syncope, weakness, light-headedness and headaches.       Sudden onset bilateral lower extremity numbness which has resolved at this time.     Physical Exam Updated Vital Signs BP (!) 162/90   Pulse 97   Temp 98.7 F (37.1 C) (Oral)   Resp 16   Ht  (1.803 m)   Wt 111.1 kg (245 lb)   LMP 12/07/2017   SpO2 98%   BMI 34.17 kg/m   Physical Exam  Constitutional: She appears well-developed and well-nourished. No distress.  Afebrile, nontoxic-appearing, sitting comfortably in bed no acute distress.  HENT:  Head: Normocephalic and atraumatic.  Eyes: Conjunctivae are normal.  Neck: Normal range of motion. Neck supple.  Cardiovascular: Normal rate, regular rhythm and intact distal pulses.  Pulmonary/Chest: Effort normal. No respiratory distress.  Musculoskeletal: Normal range of motion. She exhibits edema and tenderness.  Tenderness to palpation of the dorsum of the right foot, tenderness palpation of the left lateral malleolus.  mild edema.  Full range of motion.  Midline tenderness palpation of the upper lumbar/lower thoracic spine which patient reports is baseline.  Neurological: She is alert. No sensory deficit.  5/5 strength to hip flexion, knee flexion extension bilaterally.  Strong dorsalis pedis pulses bilaterally.  Sensation to light touch intact. NVI.  Skin: Skin is warm and dry. Capillary refill takes less than 2 seconds. She is not diaphoretic. No pallor.  Psychiatric: She has a normal mood and affect.  Nursing note and vitals reviewed.    ED Treatments / Results  Labs (all labs ordered are listed, but only abnormal results are displayed) Labs Reviewed - No data to display  EKG None  Radiology No results found.  Procedures Procedures (including critical care time) SPLINT APPLICATION Date/Time: 1:17 AM Authorized by: Georgiana Shore Consent: Verbal consent obtained. Risks and benefits: risks, benefits and alternatives were  discussed Consent given by: patient Splint applied by: Nursing Location details: right ankle Splint type: ASO  supplies used: aso Post-procedure: The splinted body part was neurovascularly unchanged following the procedure. Patient tolerance: Patient tolerated the procedure well with no immediate complications.   Medications Ordered in ED Medications  HYDROcodone-acetaminophen (NORCO/VICODIN) 5-325 MG per tablet 1 tablet (has no administration in time range)  predniSONE (DELTASONE) tablet 60 mg (has no administration in time range)     Initial Impression / Assessment and Plan / ED Course  I have reviewed the triage vital signs and the nursing notes.  Pertinent labs & imaging results that were available during my care of the patient were reviewed by me and considered in my medical decision making (see chart for details).    Patient presenting after a fall in which she reports feeling  Bilateral paresthesia. She explains that this has been an ongoing issue over the last year and she has been followed  by her PCP but has refused to follow up with neurosurgery. She denies any new symptoms other than bilateral ankle pain and superficial abrasion from the fall.  No saddle paresthesia, loss of bowel bladder function, fever, chills, history of IV drug use or malignancy.  No numbness or weakness here in the emergency department.  Symptoms are intermittent and have now resolved.  Patient has 5/5 strength in lower extremities bilaterally and is neurovascularly intact.  Patient sustained bilateral ankle injury worse on the right especially with weightbearing. Plain films negative for acute abnormalities. Rice protocol indicated and discussed with patient.  Patient was placed in an ankle brace and provided with crutches.  Patient was provided with referral to neurosurgery.  Received prednisone while in the emergency department.  Patient's pain was managed.  Will Discharge home with short course of  prednisone and symptomatic relief with close follow-up with neurosurgery and PCP.  Discussed strict return precautions and advised to return to the emergency department if experiencing any new or worsening symptoms. Instructions were understood and patient agreed with discharge plan.  Final Clinical Impressions(s) / ED Diagnoses   Final diagnoses:  Fall, initial encounter  Abrasion  Paresthesia of lower extremity    ED Discharge Orders        Ordered    predniSONE (DELTASONE) 10 MG tablet  Daily     12/18/17 0025    methocarbamol (ROBAXIN) 500 MG tablet  At bedtime PRN     12/18/17 0025       Georgiana Shore, PA-C 12/18/17 1610    Derwood Kaplan, MD 12/18/17 [id number]

## 2018-01-04 ENCOUNTER — Other Ambulatory Visit: Payer: Self-pay

## 2018-01-04 ENCOUNTER — Emergency Department (HOSPITAL_COMMUNITY)
Admission: EM | Admit: 2018-01-04 | Discharge: 2018-01-04 | Disposition: A | Discharge status: Home / Self Care | Payer: Medicaid - Out of State | Attending: Emergency Medicine | Admitting: Emergency Medicine

## 2018-01-04 ENCOUNTER — Emergency Department (HOSPITAL_COMMUNITY): Payer: Medicaid - Out of State

## 2018-01-04 ENCOUNTER — Encounter (HOSPITAL_COMMUNITY): Payer: Self-pay | Admitting: *Deleted

## 2018-01-04 DIAGNOSIS — Z79899 Other long term (current) drug therapy: Secondary | ICD-10-CM | POA: Diagnosis not present

## 2018-01-04 DIAGNOSIS — F17210 Nicotine dependence, cigarettes, uncomplicated: Secondary | ICD-10-CM | POA: Insufficient documentation

## 2018-01-04 DIAGNOSIS — J039 Acute tonsillitis, unspecified: Secondary | ICD-10-CM | POA: Diagnosis not present

## 2018-01-04 DIAGNOSIS — J029 Acute pharyngitis, unspecified: Secondary | ICD-10-CM | POA: Diagnosis present

## 2018-01-04 LAB — GROUP A STREP BY PCR: Group A Strep by PCR: NOT DETECTED

## 2018-01-04 MED ORDER — AZITHROMYCIN 250 MG PO TABS: ORAL_TABLET | ORAL | 0 refills | Status: DC

## 2018-01-04 MED ORDER — IBUPROFEN 600 MG PO TABS: 600.0000 mg | ORAL_TABLET | Freq: Four times a day (QID) | ORAL | 0 refills | Status: DC | PRN

## 2018-01-04 MED ORDER — AZITHROMYCIN 250 MG PO TABS
500.0000 mg | ORAL_TABLET | Freq: Once | ORAL | Status: AC
  Administered 2018-01-04: 500 mg via ORAL
  Filled 2018-01-04: qty 2

## 2018-01-04 MED ORDER — MAGIC MOUTHWASH W/LIDOCAINE: 10.0000 mL | Freq: Four times a day (QID) | ORAL | 0 refills | Status: DC | PRN

## 2018-01-04 MED ORDER — IBUPROFEN 800 MG PO TABS
800.0000 mg | ORAL_TABLET | Freq: Once | ORAL | Status: AC
  Administered 2018-01-04: 800 mg via ORAL
  Filled 2018-01-04: qty 1

## 2018-01-04 NOTE — ED Triage Notes (Signed)
Pt with sore throat and earache for past 2 days, cough and sinus drainage as well.  Pt denies fever or N/V/D

## 2018-01-04 NOTE — Discharge Instructions (Addendum)
Take your next dose of Zithromax tomorrow evening.  Use ibuprofen for pain relief.  Rest to make sure you are drinking plenty of fluids.  Avoid any salty, spicy or acidic foods which can worsen your throat pain.

## 2018-01-04 NOTE — ED Provider Notes (Signed)
[facility]   CSN: [id number] Arrival date & time: 01/04/18  1817     History   Chief Complaint Chief Complaint  Patient presents with  . Sore Throat    HPI Renee Hunter is a 37 y.o. female presenting with a  2 day history of uri type symptoms which includes nasal congestion with thick green nasal dc, sore throat, enlarged tonsils,  low grade fever and nonproductive cough.  Symptoms do not include shortness of breath, wheezing, chest pain,  Nausea, vomiting or diarrhea.  The patient has taken tylenol cold formula prior to arrival with no significant improvement in symptoms. .  The history is provided by the patient.    Past Medical History:  Diagnosis Date  . Chronic back pain   . Migraines     There are no active problems to display for this patient.   Past Surgical History:  Procedure Laterality Date  . CESAREAN SECTION    . TUBAL LIGATION    . wisdom teeth       OB History   None      Home Medications    Prior to Admission medications   Medication Sig Start Date End Date Taking? Authorizing Provider  acetaminophen (TYLENOL) 500 MG tablet Take 1,000 mg by mouth every 6 (six) hours as needed for mild pain, moderate pain, fever or headache.   Yes [provider]  clonazePAM (KLONOPIN) 0.5 MG tablet Take 0.5 mg by mouth as needed for anxiety.   Yes [provider]  escitalopram (LEXAPRO) 20 MG tablet Take 20 mg by mouth daily.   Yes [provider]  methocarbamol (ROBAXIN) 500 MG tablet Take 1 tablet (500 mg total) by mouth at bedtime as needed. 12/18/17  Yes Mathews RobinsonsMitchell, Jessica B, PA-C  Phenylephrine-DM-GG-APAP (TYLENOL COLD/FLU SEVERE) 5-10-200-325 MG TABS Take by mouth.   Yes [provider]  aspirin-acetaminophen-caffeine (EXCEDRIN MIGRAINE) [phone] MG tablet Take 1 tablet by mouth every 6 (six) hours as needed for headache.    [provider]  azithromycin (ZITHROMAX Z-PAK)  250 MG tablet Take one tablet daily for 4 days 01/05/18   Alieyah Spader, Raynelle FanningJulie, PA-C  ibuprofen (ADVIL,MOTRIN) 600 MG tablet Take 1 tablet (600 mg total) by mouth every 6 (six) hours as needed for moderate pain. 01/04/18   Burgess AmorIdol, Keigan Tafoya, PA-C  magic mouthwash w/lidocaine SOLN Take 10 mLs by mouth 4 (four) times daily as needed (throat pain). Note to pharmacy - equal parts diphendydramine, aluminum hydroxide and lidocaine HCL 01/04/18   Burgess AmorIdol, Ramel Tobon, PA-C    Family History History reviewed. No pertinent family history.  Social History Social History   Tobacco Use  . Smoking status: Current Some Day Smoker    Types: Cigarettes  . Smokeless tobacco: Never Used  Substance Use Topics  . Alcohol use: No  . Drug use: No     Allergies   Mushroom extract complex and Penicillins   Review of Systems Review of Systems  Constitutional: Positive for fever. Negative for chills.  HENT: Positive for congestion, ear pain, rhinorrhea and sore throat. Negative for sinus pressure, trouble swallowing and voice change.   Eyes: Negative for discharge.  Respiratory: Positive for cough. Negative for shortness of breath, wheezing and stridor.   Cardiovascular: Negative for chest pain.  Gastrointestinal: Negative for abdominal pain, nausea and vomiting.  Genitourinary: Negative.   Musculoskeletal: Negative.  Negative for neck stiffness.  Skin: Negative for rash.     Physical Exam Updated Vital Signs  BP 124/69 (BP Location: Right Arm)   Pulse 86   Temp 98.3 F (36.8 C) (Oral)   Resp 17   Ht 5\' 11"  (1.803 m)   Wt 102.1 kg (225 lb)   LMP 01/04/2018   SpO2 100%   BMI 31.38 kg/m   Physical Exam  Constitutional: She is oriented to person, place, and time. She appears well-developed and well-nourished.  HENT:  Head: Normocephalic and atraumatic.  Right Ear: Tympanic membrane and ear canal normal.  Left Ear: Tympanic membrane and ear canal normal.  Nose: Mucosal edema and rhinorrhea present.  Mouth/Throat:  Uvula is midline and mucous membranes are normal. No trismus in the jaw. Posterior oropharyngeal erythema present. No oropharyngeal exudate, posterior oropharyngeal edema or tonsillar abscesses. Tonsils are 3+ on the right. Tonsils are 3+ on the left. No tonsillar exudate.  No sinus ttp.  Bilateral tonsillar edema and erythema which is symmetric. No peritonsillar abscess. No trismus. Sublingual space soft.   Eyes: Conjunctivae are normal.  Cardiovascular: Normal rate and normal heart sounds.  Pulmonary/Chest: Effort normal. No respiratory distress. She has no wheezes. She has no rales.  Musculoskeletal: Normal range of motion.  Neurological: She is alert and oriented to person, place, and time.  Skin: Skin is warm and dry. No rash noted.  Psychiatric: She has a normal mood and affect.     ED Treatments / Results  Labs (all labs ordered are listed, but only abnormal results are displayed) Labs Reviewed  GROUP A STREP BY PCR    EKG None  Radiology Dg Chest 2 View  Result Date: 01/04/2018 CLINICAL DATA:  37 y/o F; sore throat and earache for 2 days. Cough and sinus drainage is well. EXAM: CHEST - 2 VIEW COMPARISON:  None. FINDINGS: The heart size and mediastinal contours are within normal limits. Both lungs are clear. The visualized skeletal structures are unremarkable. IMPRESSION: No active cardiopulmonary disease. Electronically Signed   By: Mitzi Hansen M.D.   On: 01/04/2018 19:59    Procedures Procedures (including critical care time)  Medications Ordered in ED Medications  ibuprofen (ADVIL,MOTRIN) tablet 800 mg (800 mg Oral Given 01/04/18 2131)  azithromycin (ZITHROMAX) tablet 500 mg (500 mg Oral Given 01/04/18 2130)     Initial Impression / Assessment and Plan / ED Course  I have reviewed the triage vital signs and the nursing notes.  Pertinent labs & imaging results that were available during my care of the patient were reviewed by me and considered in my medical  decision making (see chart for details).     Pt with acute tonsillitis, rapid strep negative but cx pending.  Significantly enlarged tonsils without obstruction to posterior pharynx. No peritonsillar abscess. Pt covered with zithromax, also prescribed ibuprofen and magic mouthwash for pain control. Advised recheck by pcp for any worsening or persistent sx.      Final Clinical Impressions(s) / ED Diagnoses   Final diagnoses:  Tonsillitis    ED Discharge Orders        Ordered    azithromycin (ZITHROMAX Z-PAK) 250 MG tablet     01/04/18 2105    ibuprofen (ADVIL,MOTRIN) 600 MG tablet  Every 6 hours PRN     01/04/18 2105    magic mouthwash w/lidocaine SOLN  4 times daily PRN     01/04/18 2106       Burgess Amor, PA-C 01/05/18 0132    Raeford Razor, MD 01/07/18 1258

## 2018-02-14 ENCOUNTER — Emergency Department (HOSPITAL_COMMUNITY)
Admission: EM | Admit: 2018-02-14 | Discharge: 2018-02-14 | Disposition: A | Discharge status: Home / Self Care | Payer: Medicaid - Out of State | Attending: Emergency Medicine | Admitting: Emergency Medicine

## 2018-02-14 ENCOUNTER — Emergency Department (HOSPITAL_COMMUNITY): Payer: Medicaid - Out of State

## 2018-02-14 ENCOUNTER — Encounter (HOSPITAL_COMMUNITY): Payer: Self-pay | Admitting: Emergency Medicine

## 2018-02-14 ENCOUNTER — Other Ambulatory Visit: Payer: Self-pay

## 2018-02-14 DIAGNOSIS — M79671 Pain in right foot: Secondary | ICD-10-CM

## 2018-02-14 DIAGNOSIS — J039 Acute tonsillitis, unspecified: Secondary | ICD-10-CM | POA: Diagnosis not present

## 2018-02-14 DIAGNOSIS — F17210 Nicotine dependence, cigarettes, uncomplicated: Secondary | ICD-10-CM | POA: Insufficient documentation

## 2018-02-14 DIAGNOSIS — J029 Acute pharyngitis, unspecified: Secondary | ICD-10-CM | POA: Diagnosis present

## 2018-02-14 DIAGNOSIS — Z79899 Other long term (current) drug therapy: Secondary | ICD-10-CM | POA: Insufficient documentation

## 2018-02-14 HISTORY — DX: Major depressive disorder, single episode, unspecified: F32.9

## 2018-02-14 HISTORY — DX: Depression, unspecified: F32.A

## 2018-02-14 MED ORDER — MAGIC MOUTHWASH W/LIDOCAINE: 10.0000 mL | Freq: Four times a day (QID) | ORAL | 0 refills | Status: DC | PRN

## 2018-02-14 MED ORDER — CLINDAMYCIN HCL 300 MG PO CAPS: 300.0000 mg | ORAL_CAPSULE | Freq: Three times a day (TID) | ORAL | 0 refills | Status: DC

## 2018-02-14 MED ORDER — IBUPROFEN 600 MG PO TABS: 600.0000 mg | ORAL_TABLET | Freq: Four times a day (QID) | ORAL | 0 refills | Status: DC | PRN

## 2018-02-14 NOTE — ED Notes (Signed)
Pt complains of ST since last night  Worse this am  Pt reports that she smokes  noone else is sick in her home

## 2018-02-14 NOTE — ED Triage Notes (Signed)
Patient c/o sore throat that started this morning with cough. Denies any fevers. Patient also c/o right ankle and foot pain. Per patient fell 2 months ago and had an x-ray, in which she was told sprain. Per patient no improvement since x-ray.

## 2018-02-14 NOTE — Discharge Instructions (Addendum)
Take the entire course of the antibiotic prescribed.  The x-ray of your foot is normal today for bony injury or dislocation.  It is possible you have a tendon or ligament tear as a result of your prior ankle sprain which is causing you to have continued pain and swelling.  You should see an orthopedist for further evaluation of this possibility.  Rest as much as possible, use ibuprofen for pain and swelling as prescribed.  I would continue wearing the splint to originally given if this improves your symptoms.  I have referred you to Dr. Romeo [place]Harrison [place].  You may also contact your primary doctor for an orthopedist referral closer to home.

## 2018-02-16 NOTE — ED Provider Notes (Signed)
[facility] EMERGENCY DEPARTMENT Provider Note   CSN: [id number] Arrival date & time: 02/14/18  1250     History   Chief Complaint Chief Complaint  Patient presents with  . Sore Throat    HPI Renee Hunter is a 37 y.o. female presenting with swollen, reddened tonsils with significant throat pain which she woke with this am.  She reports having frequent episodes of tonsillitis, the last episode approx 8 months ago. She denies fevers, chills, nasal congestion, drainage. She does have a mild dry cough, denies sob or cp.  Secondly, was seen here 2 months ago and diagnosed with a right ankle sprain for which she used the RICE protocol and wore an aso brace. She states with significant weight bearing/walking, especially by the end of the day, she continues to have pain and swelling of her right lateral foot and ankle.  She has not had f/u care for this injury.  Pt has found no alleviators for either complaint. She has taken ibuprofen this am with transient improvement in her throat pain.  The history is provided by the patient.    Past Medical History:  Diagnosis Date  . Chronic back pain   . Depression   . Migraines     There are no active problems to display for this patient.   Past Surgical History:  Procedure Laterality Date  . CESAREAN SECTION    . TUBAL LIGATION    . wisdom teeth       OB History   None      Home Medications    Prior to Admission medications   Medication Sig Start Date End Date Taking? Authorizing Provider  acetaminophen (TYLENOL) 500 MG tablet Take 1,000 mg by mouth every 6 (six) hours as needed for mild pain, moderate pain, fever or headache.    [provider]  aspirin-acetaminophen-caffeine (EXCEDRIN MIGRAINE) [phone] MG tablet Take 1 tablet by mouth every 6 (six) hours as needed for headache.    [provider]  azithromycin (ZITHROMAX Z-PAK) 250 MG tablet Take one tablet daily for 4 days 01/05/18   Burgess Amor, PA-C    clindamycin (CLEOCIN) 300 MG capsule Take 1 capsule (300 mg total) by mouth 3 (three) times daily. 02/14/18   Burgess Amor, PA-C  clonazePAM (KLONOPIN) 0.5 MG tablet Take 0.5 mg by mouth as needed for anxiety.    [provider]  escitalopram (LEXAPRO) 20 MG tablet Take 20 mg by mouth daily.    [provider]  ibuprofen (ADVIL,MOTRIN) 600 MG tablet Take 1 tablet (600 mg total) by mouth every 6 (six) hours as needed. 02/14/18   Burgess Amor, PA-C  magic mouthwash w/lidocaine SOLN Take 10 mLs by mouth 4 (four) times daily as needed (throat pain). Note to pharmacy - equal parts diphendydramine, aluminum hydroxide and lidocaine HCL 02/14/18   Gabriel Paulding, Raynelle Fanning, PA-C  methocarbamol (ROBAXIN) 500 MG tablet Take 1 tablet (500 mg total) by mouth at bedtime as needed. 12/18/17   Mathews Robinsons B, PA-C  Phenylephrine-DM-GG-APAP (TYLENOL COLD/FLU SEVERE) 5-10-200-325 MG TABS Take by mouth.    [provider]    Family History No family history on file.  Social History Social History   Tobacco Use  . Smoking status: Current Some Day Smoker    Types: Cigarettes  . Smokeless tobacco: Never Used  Substance Use Topics  . Alcohol use: No  . Drug use: No     Allergies   Bee venom; Mushroom extract complex; and Penicillins  Review of Systems Review of Systems  Constitutional: Negative for chills and fever.  HENT: Positive for sore throat. Negative for congestion, ear pain, postnasal drip, rhinorrhea, sinus pressure, trouble swallowing and voice change.   Eyes: Negative for discharge.  Respiratory: Positive for cough. Negative for shortness of breath, wheezing and stridor.   Cardiovascular: Negative for chest pain.  Gastrointestinal: Negative for abdominal pain.  Genitourinary: Negative.   Musculoskeletal: Positive for arthralgias and joint swelling.  Skin: Negative for wound.  Neurological: Negative for weakness and numbness.     Physical Exam Updated Vital Signs BP  114/66 (BP Location: Right Arm)   Pulse 65   Temp 97.7 F (36.5 C) (Oral)   Resp 18   Ht 5\' 11"  (1.803 m)   Wt 106.6 kg (235 lb)   LMP 01/31/2018   SpO2 96%   BMI 32.78 kg/m   Physical Exam  Constitutional: She is oriented to person, place, and time. She appears well-developed and well-nourished.  HENT:  Head: Normocephalic and atraumatic.  Right Ear: Tympanic membrane and ear canal normal.  Left Ear: Tympanic membrane and ear canal normal.  Nose: No mucosal edema or rhinorrhea.  Mouth/Throat: Uvula is midline and mucous membranes are normal. No trismus in the jaw. No uvula swelling. Posterior oropharyngeal erythema present. No oropharyngeal exudate, posterior oropharyngeal edema or tonsillar abscesses. Tonsils are 2+ on the right. Tonsils are 2+ on the left. Tonsillar exudate.  No peritonsillar abscess.  Eyes: Conjunctivae are normal.  Cardiovascular: Normal rate, normal heart sounds and intact distal pulses. Exam reveals no decreased pulses.  Pulses:      Dorsalis pedis pulses are 2+ on the right side, and 2+ on the left side.       Posterior tibial pulses are 2+ on the right side, and 2+ on the left side.  Pulmonary/Chest: Effort normal. No respiratory distress. She has no wheezes. She has no rales.  Abdominal: Soft. There is no tenderness.  Musculoskeletal: Normal range of motion. She exhibits edema and tenderness.       Right ankle: She exhibits swelling. She exhibits normal range of motion, no ecchymosis, no deformity and normal pulse. Tenderness. No lateral malleolus, no head of 5th metatarsal and no proximal fibula tenderness found. Achilles tendon normal.  ttp with mild edema right proximal lateral right  midfoot.  Neurological: She is alert and oriented to person, place, and time. No sensory deficit.  Skin: Skin is warm, dry and intact. No rash noted.  Psychiatric: She has a normal mood and affect.  Nursing note and vitals reviewed.    ED Treatments / Results   Labs (all labs ordered are listed, but only abnormal results are displayed) Labs Reviewed - No data to display  EKG None  Radiology No results found.  Dg Foot Complete Right  Result Date: 02/14/2018 CLINICAL DATA:  37 year old female with history of right foot and ankle pain after fall 2 months ago. Pain most severe overlying the fourth and fifth metatarsals. EXAM: RIGHT FOOT COMPLETE - 3+ VIEW COMPARISON:  Right ankle radiograph 12/18/2017. FINDINGS: There is no evidence of fracture or dislocation. There is no evidence of arthropathy or other focal bone abnormality. Soft tissues are unremarkable. IMPRESSION: Negative. Electronically Signed   By: Trudie Reed M.D.   On: 02/14/2018 14:16    Procedures Procedures (including critical care time)  Medications Ordered in ED Medications - No data to display   Initial Impression / Assessment and Plan / ED Course  I have reviewed  the triage vital signs and the nursing notes.  Pertinent labs & imaging results that were available during my care of the patient were reviewed by me and considered in my medical decision making (see chart for details).     Pt with acute tonsillitis, 3rd episode in 8 months, she was placed on clindamycin given PCN allergy and had a z pack when seen here early last month for similar sx. Discussed need for f/u with pcp to consider referral to  ENT given frequency of these infections. No trismus, no peritonsillar abscess.  Also advised continued RICE for foot, aso. Referral to ortho for f/u care, suspect pt may have ligament or other soft tissue tear causing persistent pain and swelling with use. referral given.  Final Clinical Impressions(s) / ED Diagnoses   Final diagnoses:  Tonsillitis  Foot pain, right    ED Discharge Orders        Ordered    clindamycin (CLEOCIN) 300 MG capsule  3 times daily     02/14/18 1438    magic mouthwash w/lidocaine SOLN  4 times daily PRN     02/14/18 1438    ibuprofen  (ADVIL,MOTRIN) 600 MG tablet  Every 6 hours PRN     02/14/18 1442       Burgess Amordol, Janella Rogala, PA-C 02/16/18 1552    Donnetta Hutchingook, Brian, MD 02/17/18 [phone]

## 2018-03-29 ENCOUNTER — Other Ambulatory Visit: Payer: Self-pay

## 2018-03-29 ENCOUNTER — Emergency Department (HOSPITAL_COMMUNITY)
Admission: EM | Admit: 2018-03-29 | Discharge: 2018-03-29 | Disposition: A | Discharge status: Home / Self Care | Payer: Medicaid - Out of State | Attending: Emergency Medicine | Admitting: Emergency Medicine

## 2018-03-29 ENCOUNTER — Encounter (HOSPITAL_COMMUNITY): Payer: Self-pay | Admitting: Emergency Medicine

## 2018-03-29 DIAGNOSIS — M545 Low back pain, unspecified: Secondary | ICD-10-CM

## 2018-03-29 DIAGNOSIS — F17210 Nicotine dependence, cigarettes, uncomplicated: Secondary | ICD-10-CM | POA: Diagnosis not present

## 2018-03-29 DIAGNOSIS — Z79899 Other long term (current) drug therapy: Secondary | ICD-10-CM | POA: Insufficient documentation

## 2018-03-29 MED ORDER — CYCLOBENZAPRINE HCL 10 MG PO TABS
10.0000 mg | ORAL_TABLET | Freq: Once | ORAL | Status: AC
  Administered 2018-03-29: 10 mg via ORAL
  Filled 2018-03-29: qty 1

## 2018-03-29 MED ORDER — METHOCARBAMOL 500 MG PO TABS: ORAL_TABLET | ORAL | 0 refills | Status: DC

## 2018-03-29 MED ORDER — KETOROLAC TROMETHAMINE 30 MG/ML IJ SOLN
60.0000 mg | Freq: Once | INTRAMUSCULAR | Status: AC
  Administered 2018-03-29: 60 mg via INTRAMUSCULAR
  Filled 2018-03-29: qty 2

## 2018-03-29 NOTE — Discharge Instructions (Addendum)
Use ice and heat for comfort.  You can take ibuprofen 600 mg 4 times a day OR Aleve 2 tablets twice a day for pain.  Take the Robaxin for muscle spasms.  But your doctor know if you are not improving in the next 48 hours or if you seem worse.

## 2018-03-29 NOTE — ED Triage Notes (Signed)
Pt states she has been having "really bad back spasms" that started yesterday.

## 2018-03-29 NOTE — ED Provider Notes (Signed)
[facility] Provider [facility]   CSN: [id number] Arrival date & time: 03/29/18  0111  Time seen 02:00 AM   History   Chief Complaint Chief Complaint  Patient presents with  . Back Pain    HPI Renee Hunter is a 37 y.o. female.  HPI patient states she has been having what she calls muscle spasms in her back off and on for the past few years.  Her primary doctor gives her Robaxin which usually helps.  She states she has run out of her Robaxin.  She states tonight about 11:30 PM she been sitting on the couch and she stood up and sat on the floor to play for children when the pain started.  She states it is in her bilateral lower back.  She states when she bends over the pain gets worse and is sharp.  She has tried ibuprofen tonight, cold and hot without relief.  Nothing has made it feel better.  She states tonight she had some radiation in her upper thighs her legs felt weak.  She denies any urinary or rectal incontinence.  PCP Virgina NorfolkBarker, Wendy L, MD   Past Medical History:  Diagnosis Date  . Chronic back pain   . Depression   . Migraines     There are no active problems to display for this patient.   Past Surgical History:  Procedure Laterality Date  . CESAREAN SECTION    . TUBAL LIGATION    . wisdom teeth       OB History   None      Home Medications    Prior to Admission medications   Medication Sig Start Date End Date Taking? Authorizing Provider  acetaminophen (TYLENOL) 500 MG tablet Take 1,000 mg by mouth every 6 (six) hours as needed for mild pain, moderate pain, fever or headache.   Yes [provider]  aspirin-acetaminophen-caffeine (EXCEDRIN MIGRAINE) [phone] MG tablet Take 1 tablet by mouth every 6 (six) hours as needed for headache.   Yes [provider]  escitalopram (LEXAPRO) 20 MG tablet Take 20 mg by mouth daily.   Yes [provider]  ibuprofen (ADVIL,MOTRIN) 600 MG tablet Take 1 tablet (600 mg total) by  mouth every 6 (six) hours as needed. 02/14/18  Yes Idol, Raynelle FanningJulie, PA-C  azithromycin (ZITHROMAX Z-PAK) 250 MG tablet Take one tablet daily for 4 days 01/05/18   Burgess AmorIdol, Julie, PA-C  clindamycin (CLEOCIN) 300 MG capsule Take 1 capsule (300 mg total) by mouth 3 (three) times daily. 02/14/18   Burgess AmorIdol, Julie, PA-C  clonazePAM (KLONOPIN) 0.5 MG tablet Take 0.5 mg by mouth as needed for anxiety.    [provider]  magic mouthwash w/lidocaine SOLN Take 10 mLs by mouth 4 (four) times daily as needed (throat pain). Note to pharmacy - equal parts diphendydramine, aluminum hydroxide and lidocaine HCL 02/14/18   Idol, Raynelle FanningJulie, PA-C  methocarbamol (ROBAXIN) 500 MG tablet Take 1 or 2 po Q 6hrs for muscle pain 03/29/18   Devoria AlbeKnapp, Kateline Kinkade, MD  Phenylephrine-DM-GG-APAP (TYLENOL COLD/FLU SEVERE) 5-10-200-325 MG TABS Take by mouth.    [provider]    Family History No family history on file.  Social History Social History   Tobacco Use  . Smoking status: Current Some Day Smoker    Types: Cigarettes  . Smokeless tobacco: Never Used  Substance Use Topics  . Alcohol use: No  . 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    Physical Exam Updated Vital Signs BP 127/81 (BP Location: Right Arm)   Pulse 89   Temp (!) 97.5 F (36.4 C) (Oral)   Resp 18   Ht 5\' 10"  (1.778 m)   Wt 106.6 kg   LMP 03/26/2018 (Exact Date)   SpO2 97%   BMI 33.72 kg/m   Vital signs normal    Physical Exam  Constitutional: She is oriented to person, place, and time. She appears well-developed and well-nourished. She appears distressed.  HENT:  Head: Normocephalic and atraumatic.  Right Ear: External ear normal.  Left Ear: External ear normal.  Nose: Nose normal.  Eyes: Conjunctivae and EOM are normal.  Neck: Normal range of motion.  Cardiovascular: Normal  rate.  Pulmonary/Chest: Effort normal. No respiratory distress.  Musculoskeletal:       Back:  Patient is tender along her diffuse lumbar spine.  She is more tender midline rather than over the paraspinous muscles bilaterally.  She has straight leg raising positive left more painful than the right.  On range of motion of her lumbar spine forward flexion is the worst followed by flexing to the right and the least was flexing to the left.  Patellar reflexes are 2+ and equal  Neurological: She is alert and oriented to person, place, and time. No cranial nerve deficit.  Skin: Skin is warm and dry. No rash noted.  Psychiatric: She has a normal mood and affect. Her behavior is normal. Thought content normal.  Nursing note and vitals reviewed.    ED Treatments / Results  Labs (all labs ordered are listed, but only abnormal results are displayed) Labs Reviewed - No data to display  EKG None  Radiology No results found.  Procedures Procedures (including critical care time)  Medications Ordered in ED Medications  ketorolac (TORADOL) 30 MG/ML injection 60 mg (60 mg Intramuscular Given 03/29/18 0214)  cyclobenzaprine (FLEXERIL) tablet 10 mg (10 mg Oral Given 03/29/18 0214)     Initial Impression / Assessment and Plan / ED Course  I have reviewed the triage vital signs and the nursing notes.  Pertinent labs & imaging results that were available during my care of the patient were reviewed by me and considered in my medical decision making (see chart for details).     Patient was given Toradol IM and oral Flexeril because her husband can drive her home.  Due to her having small children she was prescribed more Robaxin which has worked in the past.  She did not want anything sedating.  She should continue using ice and heat and let her primary care doctor know if she is not improving.  Final Clinical Impressions(s) / ED Diagnoses   Final diagnoses:  Acute midline low back pain without  sciatica    ED Discharge Orders         Ordered    methocarbamol (ROBAXIN) 500 MG tablet     03/29/18 1610        OTC ibuprofen OR aleve  Plan discharge  Devoria Albe, MD, Concha Pyo, MD 03/29/18 [phone]

## 2018-09-01 ENCOUNTER — Emergency Department (HOSPITAL_COMMUNITY)
Admission: EM | Admit: 2018-09-01 | Discharge: 2018-09-02 | Disposition: A | Discharge status: Home / Self Care | Payer: Medicaid - Out of State | Attending: Emergency Medicine | Admitting: Emergency Medicine

## 2018-09-01 ENCOUNTER — Encounter (HOSPITAL_COMMUNITY): Payer: Self-pay | Admitting: Emergency Medicine

## 2018-09-01 ENCOUNTER — Other Ambulatory Visit: Payer: Self-pay

## 2018-09-01 DIAGNOSIS — M62838 Other muscle spasm: Secondary | ICD-10-CM | POA: Diagnosis not present

## 2018-09-01 DIAGNOSIS — Z79899 Other long term (current) drug therapy: Secondary | ICD-10-CM | POA: Insufficient documentation

## 2018-09-01 DIAGNOSIS — F17210 Nicotine dependence, cigarettes, uncomplicated: Secondary | ICD-10-CM | POA: Insufficient documentation

## 2018-09-01 DIAGNOSIS — M545 Low back pain: Secondary | ICD-10-CM | POA: Diagnosis present

## 2018-09-01 DIAGNOSIS — M542 Cervicalgia: Secondary | ICD-10-CM

## 2018-09-01 NOTE — ED Triage Notes (Signed)
Pt C/O lower back pain, neck pain, and headache that started 2 weeks ago. Pt denies fevers. Pt states the pain is worse with movement.

## 2018-09-02 LAB — URINALYSIS, ROUTINE W REFLEX MICROSCOPIC
Bilirubin Urine: NEGATIVE
Glucose, UA: NEGATIVE mg/dL
Ketones, ur: NEGATIVE mg/dL
NITRITE: POSITIVE — AB
Protein, ur: NEGATIVE mg/dL
SPECIFIC GRAVITY, URINE: 1.027 (ref 1.005–1.030)
pH: 5 (ref 5.0–8.0)

## 2018-09-02 LAB — PREGNANCY, URINE: Preg Test, Ur: NEGATIVE

## 2018-09-02 MED ORDER — CYCLOBENZAPRINE HCL 10 MG PO TABS: 10.0000 mg | ORAL_TABLET | Freq: Two times a day (BID) | ORAL | 0 refills | Status: DC | PRN

## 2018-09-02 MED ORDER — KETOROLAC TROMETHAMINE 30 MG/ML IJ SOLN
30.0000 mg | Freq: Once | INTRAMUSCULAR | Status: AC
  Administered 2018-09-02: 30 mg via INTRAMUSCULAR
  Filled 2018-09-02: qty 1

## 2018-09-02 MED ORDER — CYCLOBENZAPRINE HCL 10 MG PO TABS
10.0000 mg | ORAL_TABLET | Freq: Once | ORAL | Status: AC
  Administered 2018-09-02: 10 mg via ORAL
  Filled 2018-09-02: qty 1

## 2018-09-02 NOTE — ED Provider Notes (Signed)
[facility] EMERGENCY DEPARTMENT Provider Note   CSN: [id number] Arrival date & time: 09/01/18  2154     History   Chief Complaint Chief Complaint  Patient presents with  . Back Pain    HPI Renee Hunter is a 38 y.o. female.  The history is provided by the patient.  Back Pain  Location:  Thoracic spine and lumbar spine Quality:  Aching (spasm) Pain severity:  Severe Pain is:  Same all the time Onset quality:  Gradual Duration:  2 weeks Timing:  Constant Progression:  Worsening Chronicity:  Recurrent Context: not falling   Relieved by:  Nothing Worsened by:  Palpation and twisting Associated symptoms: headaches and numbness   Associated symptoms: no abdominal pain, no bladder incontinence, no bowel incontinence, no dysuria, no fever and no weakness   Patient history of chronic back pain, migraines, depression presents with back/neck/headache.  She reports it starts in her lower back and then proceeds into her neck and head. No falls.  No new weakness.  At times she will have numbness in her arms or legs.  No incontinence.  No history of back surgery. She decided to be evaluated when she brought her daughter in for similar complaint  Past Medical History:  Diagnosis Date  . Chronic back pain   . Depression   . Migraines     There are no active problems to display for this patient.   Past Surgical History:  Procedure Laterality Date  . CESAREAN SECTION    . TUBAL LIGATION    . wisdom teeth       OB History   No obstetric history on file.      Home Medications    Prior to Admission medications   Medication Sig Start Date End Date Taking? Authorizing Provider  acetaminophen (TYLENOL) 500 MG tablet Take 1,000 mg by mouth every 6 (six) hours as needed for mild pain, moderate pain, fever or headache.    [provider]  clonazePAM (KLONOPIN) 0.5 MG tablet Take 0.5 mg by mouth as needed for anxiety.    [provider]  cyclobenzaprine  (FLEXERIL) 10 MG tablet Take 1 tablet (10 mg total) by mouth 2 (two) times daily as needed for muscle spasms. 09/02/18   Zadie Rhine, MD  escitalopram (LEXAPRO) 20 MG tablet Take 20 mg by mouth daily.    [provider]    Family History No family history on file.  Social History Social History   Tobacco Use  . Smoking status: Current Some Day Smoker    Types: Cigarettes  . Smokeless tobacco: Never Used  Substance Use Topics  . Alcohol use: No  . Drug use: No     Allergies   Bee venom; Mushroom extract complex; and Penicillins   Review of Systems Review of Systems  Constitutional: Negative for fever.  Gastrointestinal: Negative for abdominal pain and bowel incontinence.  Genitourinary: Negative for bladder incontinence and dysuria.  Musculoskeletal: Positive for back pain, myalgias and neck pain.  Neurological: Positive for numbness and headaches. Negative for weakness.  All other systems reviewed and are negative.    Physical Exam Updated Vital Signs BP 123/77 (BP Location: Right Arm)   Pulse 86   Temp 98.1 F (36.7 C) (Temporal)   Resp 16   Ht 1.803 m (5\' 11" )   Wt 110.2 kg   LMP 08/24/2018   SpO2 97%   BMI 33.89 kg/m   Physical Exam  CONSTITUTIONAL: Well developed/well nourished HEAD: Normocephalic/atraumatic EYES:  EOMI/PERRL ENMT: Mucous membranes moist NECK: supple no meningeal signs SPINE/BACK: Diffuse C/T/L tenderness, no bruising/crepitance/stepoffs noted to spine Diffuse lumbar paraspinal tenderness CV: S1/S2 noted, no murmurs/rubs/gallops noted LUNGS: Lungs are clear to auscultation bilaterally, no apparent distress ABDOMEN: soft, nontender, no rebound or guarding GU:no cva tenderness NEURO: Awake/alert,equal motor 5/5 strength noted with the following: hip flexion/knee flexion/extension, foot dorsi/plantar flexion,  no clonus bilaterally, equal handgrips noted.  Equal power noted bilateral upper extremities EXTREMITIES: pulses  normal, full ROM SKIN: warm, color normal PSYCH: no abnormalities of mood noted, alert and oriented to situation   ED Treatments / Results  Labs (all labs ordered are listed, but only abnormal results are displayed) Labs Reviewed  URINALYSIS, ROUTINE W REFLEX MICROSCOPIC - Abnormal; Notable for the following components:      Result Value   APPearance HAZY (*)    Hgb urine dipstick MODERATE (*)    Nitrite POSITIVE (*)    Leukocytes, UA TRACE (*)    Bacteria, UA MANY (*)    All other components within normal limits  URINE CULTURE  PREGNANCY, URINE    EKG None  Radiology No results found.  Procedures Procedures (including critical care time)  Medications Ordered in ED Medications  ketorolac (TORADOL) 30 MG/ML injection 30 mg (30 mg Intramuscular Given 09/02/18 0018)  cyclobenzaprine (FLEXERIL) tablet 10 mg (10 mg Oral Given 09/02/18 0018)     Initial Impression / Assessment and Plan / ED Course  I have reviewed the triage vital signs and the nursing notes.  Pertinent labs  results that were available during my care of the patient were reviewed by me and considered in my medical decision making (see chart for details).     Patient felt improved after Toradol and Flexeril.  She reports long history of back pain, this is likely acute on chronic episode.  Will prescribe Flexeril as she has had good response to this in the past. Urine shows evidence of infection, but she flatly denies any dysuria or frequency.  She reports this feels like her previous chronic pain.  Will send urine culture and defer antibiotics for now.  Final Clinical Impressions(s) / ED Diagnoses   Final diagnoses:  Muscle spasm  Neck pain    ED Discharge Orders         Ordered    cyclobenzaprine (FLEXERIL) 10 MG tablet  2 times daily PRN     09/02/18 0003           Zadie Rhine, MD 09/02/18 0115

## 2018-09-02 NOTE — Discharge Instructions (Addendum)

## 2018-09-04 LAB — URINE CULTURE

## 2019-04-17 ENCOUNTER — Other Ambulatory Visit: Payer: Self-pay

## 2019-04-17 ENCOUNTER — Encounter (HOSPITAL_COMMUNITY): Payer: Self-pay | Admitting: Emergency Medicine

## 2019-04-17 ENCOUNTER — Emergency Department (HOSPITAL_COMMUNITY)
Admission: EM | Admit: 2019-04-17 | Discharge: 2019-04-17 | Discharge status: Against Medical Advice | Payer: Medicaid - Out of State | Attending: Emergency Medicine | Admitting: Emergency Medicine

## 2019-04-17 ENCOUNTER — Emergency Department (HOSPITAL_COMMUNITY): Payer: Medicaid - Out of State

## 2019-04-17 DIAGNOSIS — R0789 Other chest pain: Secondary | ICD-10-CM | POA: Diagnosis present

## 2019-04-17 DIAGNOSIS — F17210 Nicotine dependence, cigarettes, uncomplicated: Secondary | ICD-10-CM | POA: Diagnosis not present

## 2019-04-17 DIAGNOSIS — R072 Precordial pain: Secondary | ICD-10-CM | POA: Insufficient documentation

## 2019-04-17 LAB — BASIC METABOLIC PANEL
Anion gap: 10 (ref 5–15)
BUN: 12 mg/dL (ref 6–20)
CO2: 21 mmol/L — ABNORMAL LOW (ref 22–32)
Calcium: 9 mg/dL (ref 8.9–10.3)
Chloride: 108 mmol/L (ref 98–111)
Creatinine, Ser: 0.49 mg/dL (ref 0.44–1.00)
GFR calc Af Amer: >60 mL/min (ref >60)
GFR calc non Af Amer: >60 mL/min (ref >60)
Glucose, Bld: 90 mg/dL (ref 70–99)
Potassium: 4.1 mmol/L (ref 3.5–5.1)
Sodium: 139 mmol/L (ref 135–145)

## 2019-04-17 LAB — CBC WITH DIFFERENTIAL/PLATELET
Abs Immature Granulocytes: 0.06 10*3/uL (ref 0.00–0.07)
Basophils Absolute: 0.1 10*3/uL (ref 0.0–0.1)
Basophils Relative: 1 %
Eosinophils Absolute: 0.6 10*3/uL — ABNORMAL HIGH (ref 0.0–0.5)
Eosinophils Relative: 3 %
HCT: 48.2 % — ABNORMAL HIGH (ref 36.0–46.0)
Hemoglobin: 15.4 g/dL — ABNORMAL HIGH (ref 12.0–15.0)
Immature Granulocytes: 0 %
Lymphocytes Relative: 25 %
Lymphs Abs: 4.1 10*3/uL — ABNORMAL HIGH (ref 0.7–4.0)
MCH: 28.3 pg (ref 26.0–34.0)
MCHC: 32 g/dL (ref 30.0–36.0)
MCV: 88.6 fL (ref 80.0–100.0)
Monocytes Absolute: 0.9 10*3/uL (ref 0.1–1.0)
Monocytes Relative: 6 %
Neutro Abs: 10.7 10*3/uL — ABNORMAL HIGH (ref 1.7–7.7)
Neutrophils Relative %: 65 %
Platelets: 675 10*3/uL — ABNORMAL HIGH (ref 150–400)
RBC: 5.44 MIL/uL — ABNORMAL HIGH (ref 3.87–5.11)
RDW: 13.8 % (ref 11.5–15.5)
WBC: 16.5 10*3/uL — ABNORMAL HIGH (ref 4.0–10.5)
nRBC: 0 % (ref 0.0–0.2)

## 2019-04-17 LAB — TROPONIN I (HIGH SENSITIVITY): Troponin I (High Sensitivity): 394 ng/L (ref <18)

## 2019-04-17 MED ORDER — ASPIRIN 81 MG PO CHEW
324.0000 mg | CHEWABLE_TABLET | Freq: Once | ORAL | Status: AC
  Administered 2019-04-17: 324 mg via ORAL
  Filled 2019-04-17: qty 4

## 2019-04-17 NOTE — Discharge Instructions (Addendum)
Return at any point in time.  Would recommend starting to take a baby aspirin a day.  Call cardiology and set up an appointment.  Also follow-up with your regular doctor.  As you know based on your cardiac enzyme you required admission.  But you are welcome back at any time.

## 2019-04-17 NOTE — ED Notes (Signed)
Date and time results received: 04/17/19 1640 (use smartphrase ".now" to insert current time)  Test: Troponin Critical Value: 394  Name of Provider Notified: Zackowski  Orders Received? Or Actions Taken?: Orders Received - See Orders for details

## 2019-04-17 NOTE — ED Provider Notes (Signed)
[facility] EMERGENCY DEPARTMENT Khaliq Turay Note   CSN: [id number] Arrival date & time: 04/17/19  1430     History   Chief Complaint Chief Complaint  Patient presents with  . Chest Pain    HPI Renee Hunter is a 38 y.o. female.     Patient with acute onset of substernal chest pain at 830 this morning.  Then it moved to the left and right chest and numbness down both arms.  It has been intermittent since it started and is still present.  But not as severe.  Patient went to urgent care was referred here for further evaluation.  Patient has not had any history of chest pain in the past.  Not associated with nausea vomiting associated with shortness of breath.  No family history.  No known history of diabetes or hypertension.  Patient does currently smoke tobacco.  Pain is currently 3 out of 10.     Past Medical History:  Diagnosis Date  . Chronic back pain   . Depression   . Migraines     There are no active problems to display for this patient.   Past Surgical History:  Procedure Laterality Date  . CESAREAN SECTION    . TUBAL LIGATION    . wisdom teeth       OB History   No obstetric history on file.      Home Medications    Prior to Admission medications   Medication Sig Start Date End Date Taking? Authorizing Malayna Noori  acetaminophen (TYLENOL) 500 MG tablet Take 1,000 mg by mouth every 6 (six) hours as needed for mild pain, moderate pain, fever or headache.   Yes Lytle Malburg, Historical, MD  ibuprofen (ADVIL) 200 MG tablet Take 400 mg by mouth every 6 (six) hours as needed.   Yes Rosiland Sen, Historical, MD  Pseudoeph-Doxylamine-DM-APAP (NYQUIL D COLD/FLU PO) Take 1 capsule by mouth at bedtime as needed.   Yes Wael Maestas, Historical, MD    Family History No family history on file.  Social History Social History   Tobacco Use  . Smoking status: Current Some Day Smoker    Types: Cigarettes  . Smokeless tobacco: Never Used  Substance Use Topics  . Alcohol use:  No  . Drug use: No     Allergies   Bee venom, Mushroom extract complex, and Penicillins   Review of Systems Review of Systems  Constitutional: Negative for chills and fever.  HENT: Negative for congestion, rhinorrhea and sore throat.   Eyes: Negative for visual disturbance.  Respiratory: Negative for cough and shortness of breath.   Cardiovascular: Positive for chest pain. Negative for leg swelling.  Gastrointestinal: Negative for abdominal pain, diarrhea, nausea and vomiting.  Genitourinary: Negative for dysuria.  Musculoskeletal: Negative for back pain and neck pain.  Skin: Negative for rash.  Neurological: Negative for dizziness, light-headedness and headaches.  Hematological: Does not bruise/bleed easily.  Psychiatric/Behavioral: Negative for confusion.     Physical Exam Updated Vital Signs BP 119/80   Pulse 69   Temp 98.5 F (36.9 C)   Resp 20   Ht 1.727 m (5\' 8" )   Wt 129.3 kg   SpO2 95%   BMI 43.33 kg/m   Physical Exam Vitals signs and nursing note reviewed.  Constitutional:      General: She is not in acute distress.    Appearance: Normal appearance. She is well-developed.  HENT:     Head: Normocephalic and atraumatic.  Eyes:     Extraocular Movements: Extraocular  movements intact.     Conjunctiva/sclera: Conjunctivae normal.     Pupils: Pupils are equal, round, and reactive to light.  Neck:     Musculoskeletal: Normal range of motion and neck supple.  Cardiovascular:     Rate and Rhythm: Normal rate and regular rhythm.     Heart sounds: No murmur.  Pulmonary:     Effort: Pulmonary effort is normal. No respiratory distress.     Breath sounds: Normal breath sounds.  Abdominal:     Palpations: Abdomen is soft.     Tenderness: There is no abdominal tenderness.  Musculoskeletal:        General: No swelling.  Skin:    General: Skin is warm and dry.     Capillary Refill: Capillary refill takes less than 2 seconds.  Neurological:     General: No  focal deficit present.     Mental Status: She is alert and oriented to person, place, and time.      ED Treatments / Results  Labs (all labs ordered are listed, but only abnormal results are displayed) Labs Reviewed  CBC WITH DIFFERENTIAL/PLATELET - Abnormal; Notable for the following components:      Result Value   WBC 16.5 (*)    RBC 5.44 (*)    Hemoglobin 15.4 (*)    HCT 48.2 (*)    Platelets 675 (*)    Neutro Abs 10.7 (*)    Lymphs Abs 4.1 (*)    Eosinophils Absolute 0.6 (*)    All other components within normal limits  BASIC METABOLIC PANEL - Abnormal; Notable for the following components:   CO2 21 (*)    All other components within normal limits  TROPONIN I (HIGH SENSITIVITY) - Abnormal; Notable for the following components:   Troponin I (High Sensitivity) 394 (*)    All other components within normal limits  BRAIN NATRIURETIC PEPTIDE  TROPONIN I (HIGH SENSITIVITY)    EKG EKG Interpretation  Date/Time:  Saturday April 17 2019 14:39:53 EDT Ventricular Rate:  83 PR Interval:    QRS Duration: 87 QT Interval:  360 QTC Calculation: 423 R Axis:   45 Text Interpretation:  Sinus rhythm Abnormal inferior Q waves No previous ECGs available Confirmed by Fredia Sorrow [phone]) on 04/17/2019 3:20:02 PM   Radiology Dg Chest Port 1 View  Result Date: 04/17/2019 CLINICAL DATA:  Acute chest pain. EXAM: PORTABLE CHEST 1 VIEW COMPARISON:  01/04/2018 FINDINGS: The cardiomediastinal silhouette is unremarkable. Pulmonary vascular congestion noted. There is no evidence of focal airspace disease, pulmonary edema, suspicious pulmonary nodule/mass, pleural effusion, or pneumothorax. No acute bony abnormalities are identified. IMPRESSION: Pulmonary vascular congestion. Electronically Signed   By: Margarette Canada M.D.   On: 04/17/2019 17:38    Procedures Procedures (including critical care time)  Medications Ordered in ED Medications  aspirin chewable tablet 324 mg (324 mg Oral  Given 04/17/19 1610)     Initial Impression / Assessment and Plan / ED Course  I have reviewed the triage vital signs and the nursing notes.  Pertinent labs & imaging results that were available during my care of the patient were reviewed by me and considered in my medical decision making (see chart for details).        Chest x-ray negative except for raising some question of fluid on the lungs.  EKG without any acute findings.  Patient given aspirin here.  Still having intermittent chest pain.  Initial troponin high-sensitivity was greater than 300.  According to our algorithm  any troponin greater than 100 warrants admission.  Patient informed of this.  But was waiting for second troponin and BMP.  Patient stated she cannot stay she is going to leave AGAINST MEDICAL ADVICE.  She understands the risk understands that she could have had a heart attack.  Patient says she has to go home.  Recommend that she takes a baby aspirin a day and gave her outpatient referral to cardiology for follow-up.  Patient knows that she can return at any point in time or for anything new or worse.  Final Clinical Impressions(s) / ED Diagnoses   Final diagnoses:  Precordial pain    ED Discharge Orders    None       Vanetta Mulders, MD 04/17/19 1926

## 2019-04-17 NOTE — ED Triage Notes (Signed)
Pt c/o of central chest pain that radiates to both right and left side of chest and arms.

## 2019-05-17 IMAGING — DX DG ANKLE COMPLETE 3+V*L*
3 series · 3 of 3 positions shown · non-contrast
Comparison: None.

CLINICAL DATA: 36 y/o F; fall with extreme plantar flexion and
lateral malleolus pain.

EXAM:
LEFT ANKLE COMPLETE - 3+ VIEW

[ankle ap]
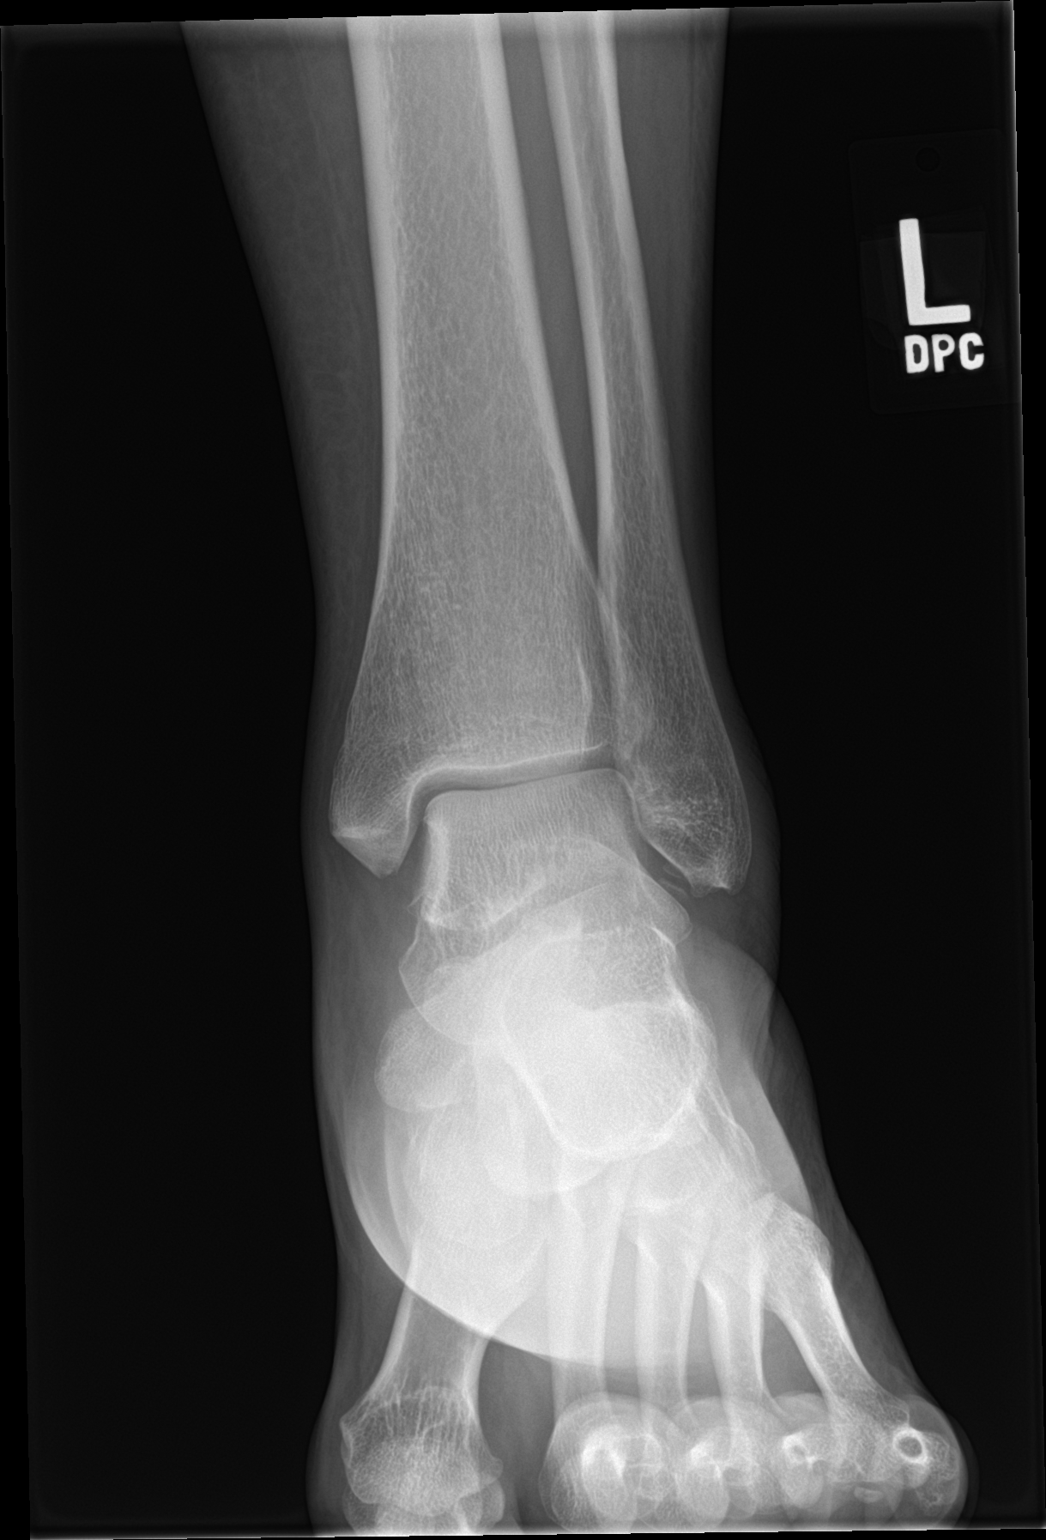

[ankle obl]
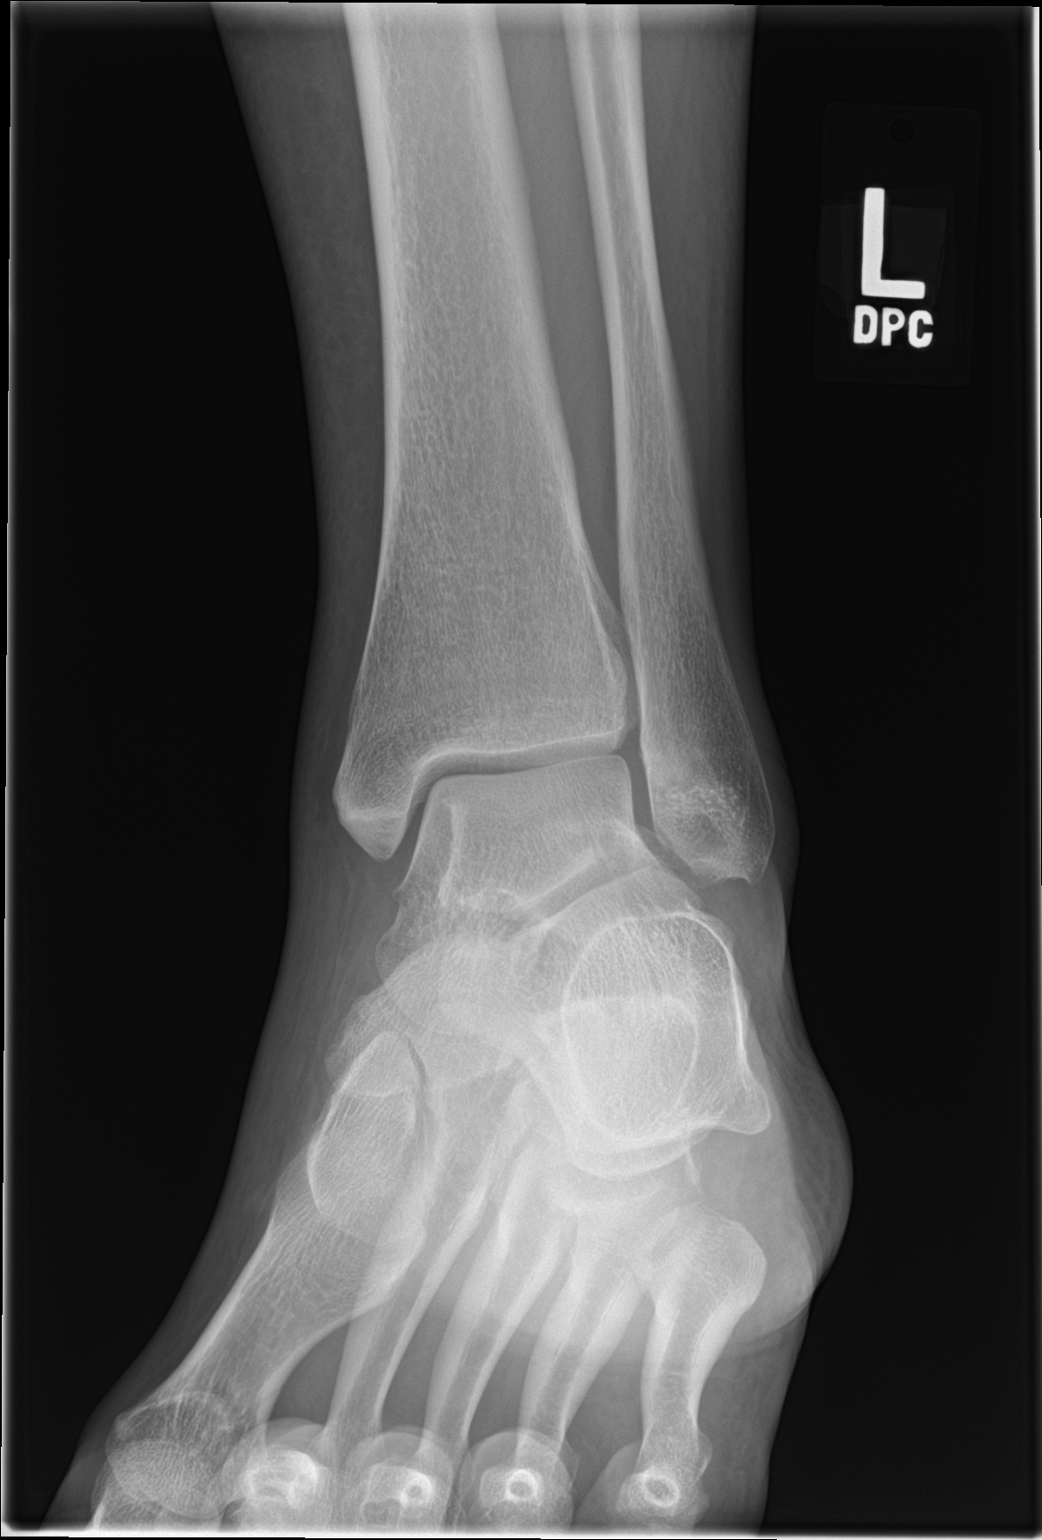

[ankle lat]
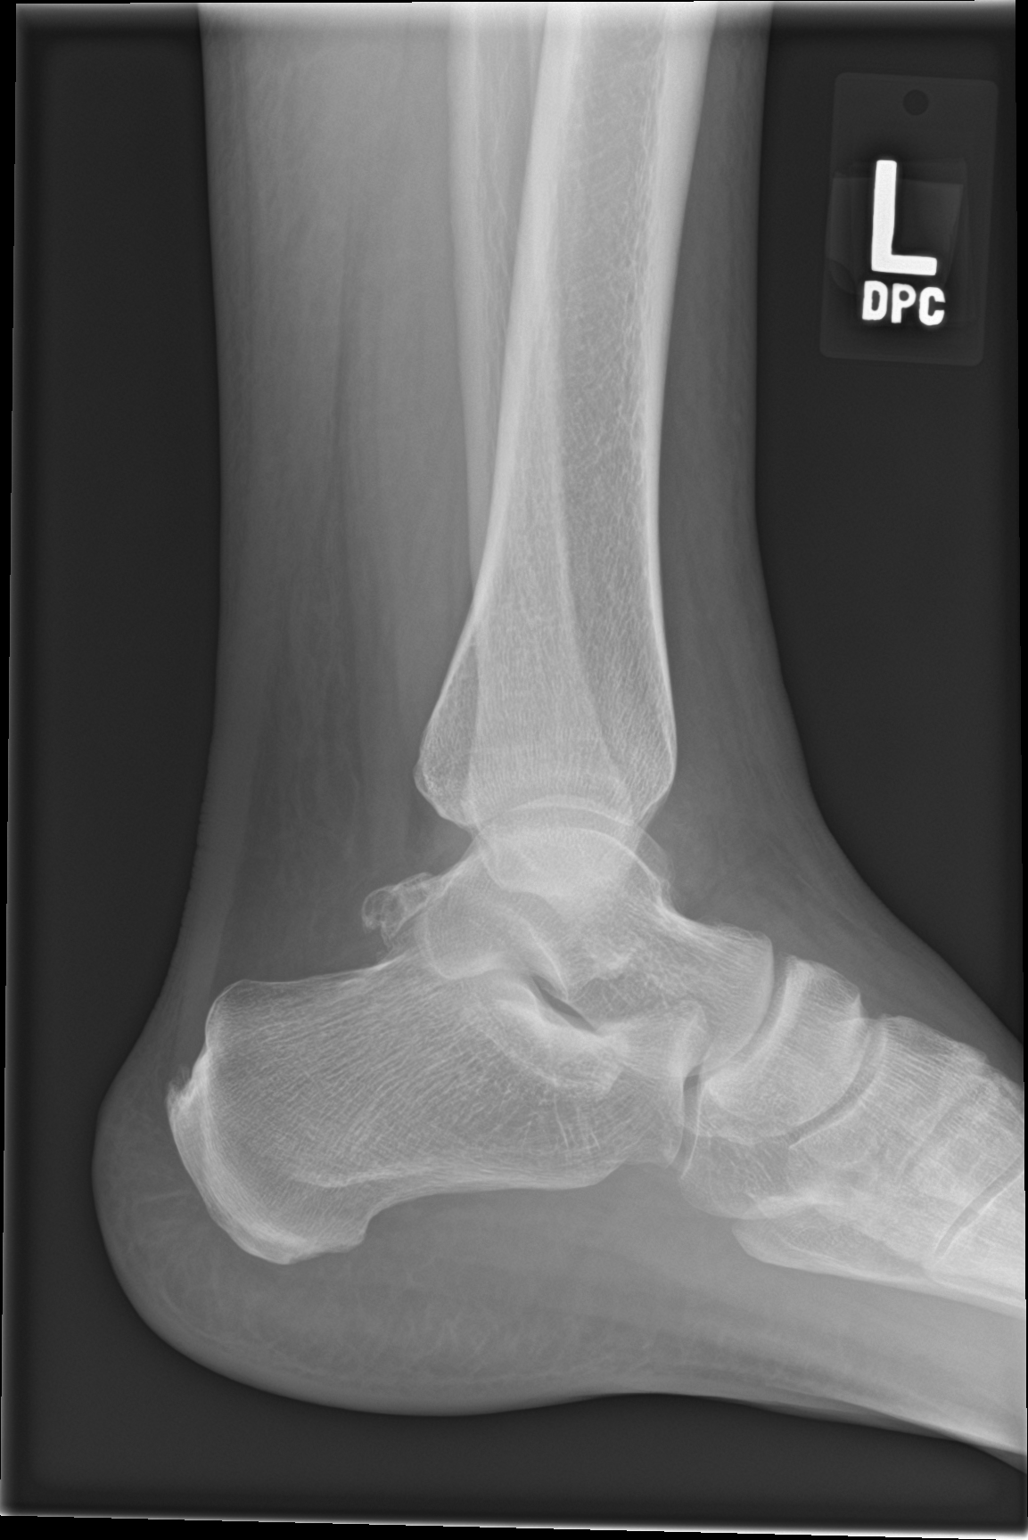

[3 of 3 positions shown; findings below may reference images not displayed]

FINDINGS: There is no evidence of fracture, dislocation, or joint effusion.
Talar dome is intact. Ankle mortise is symmetric on these nonstress
views.
IMPRESSION: No acute fracture or dislocation identified.

By: Dzikri Sedawy M.D.

## 2019-06-03 IMAGING — DX DG CHEST 2V
2 series · 2 of 2 positions shown · non-contrast
Comparison: None.

CLINICAL DATA: 36 y/o F; sore throat and earache for 2 days. Cough
and sinus drainage is well.

EXAM:
CHEST - 2 VIEW

[chest pa]
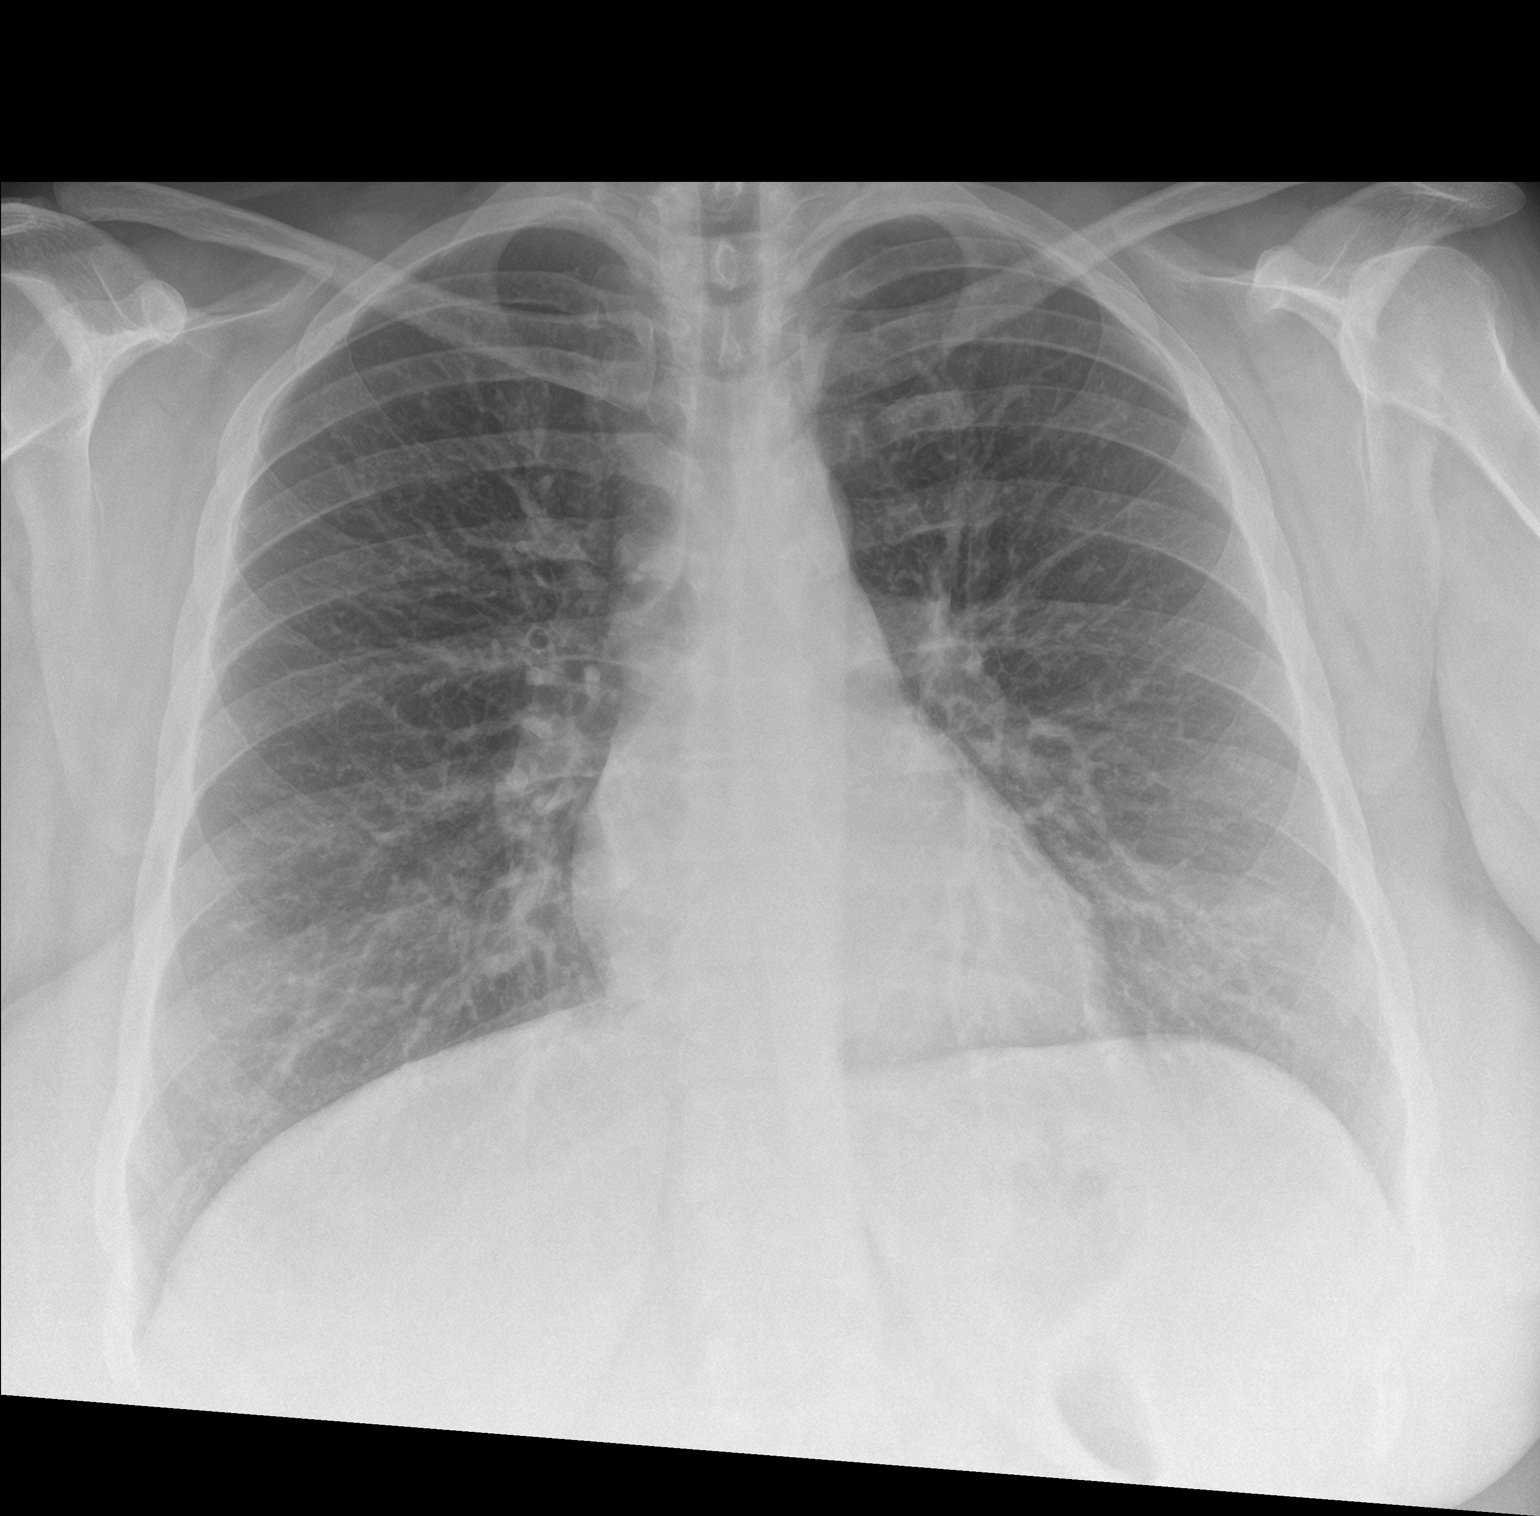

[chest lat]
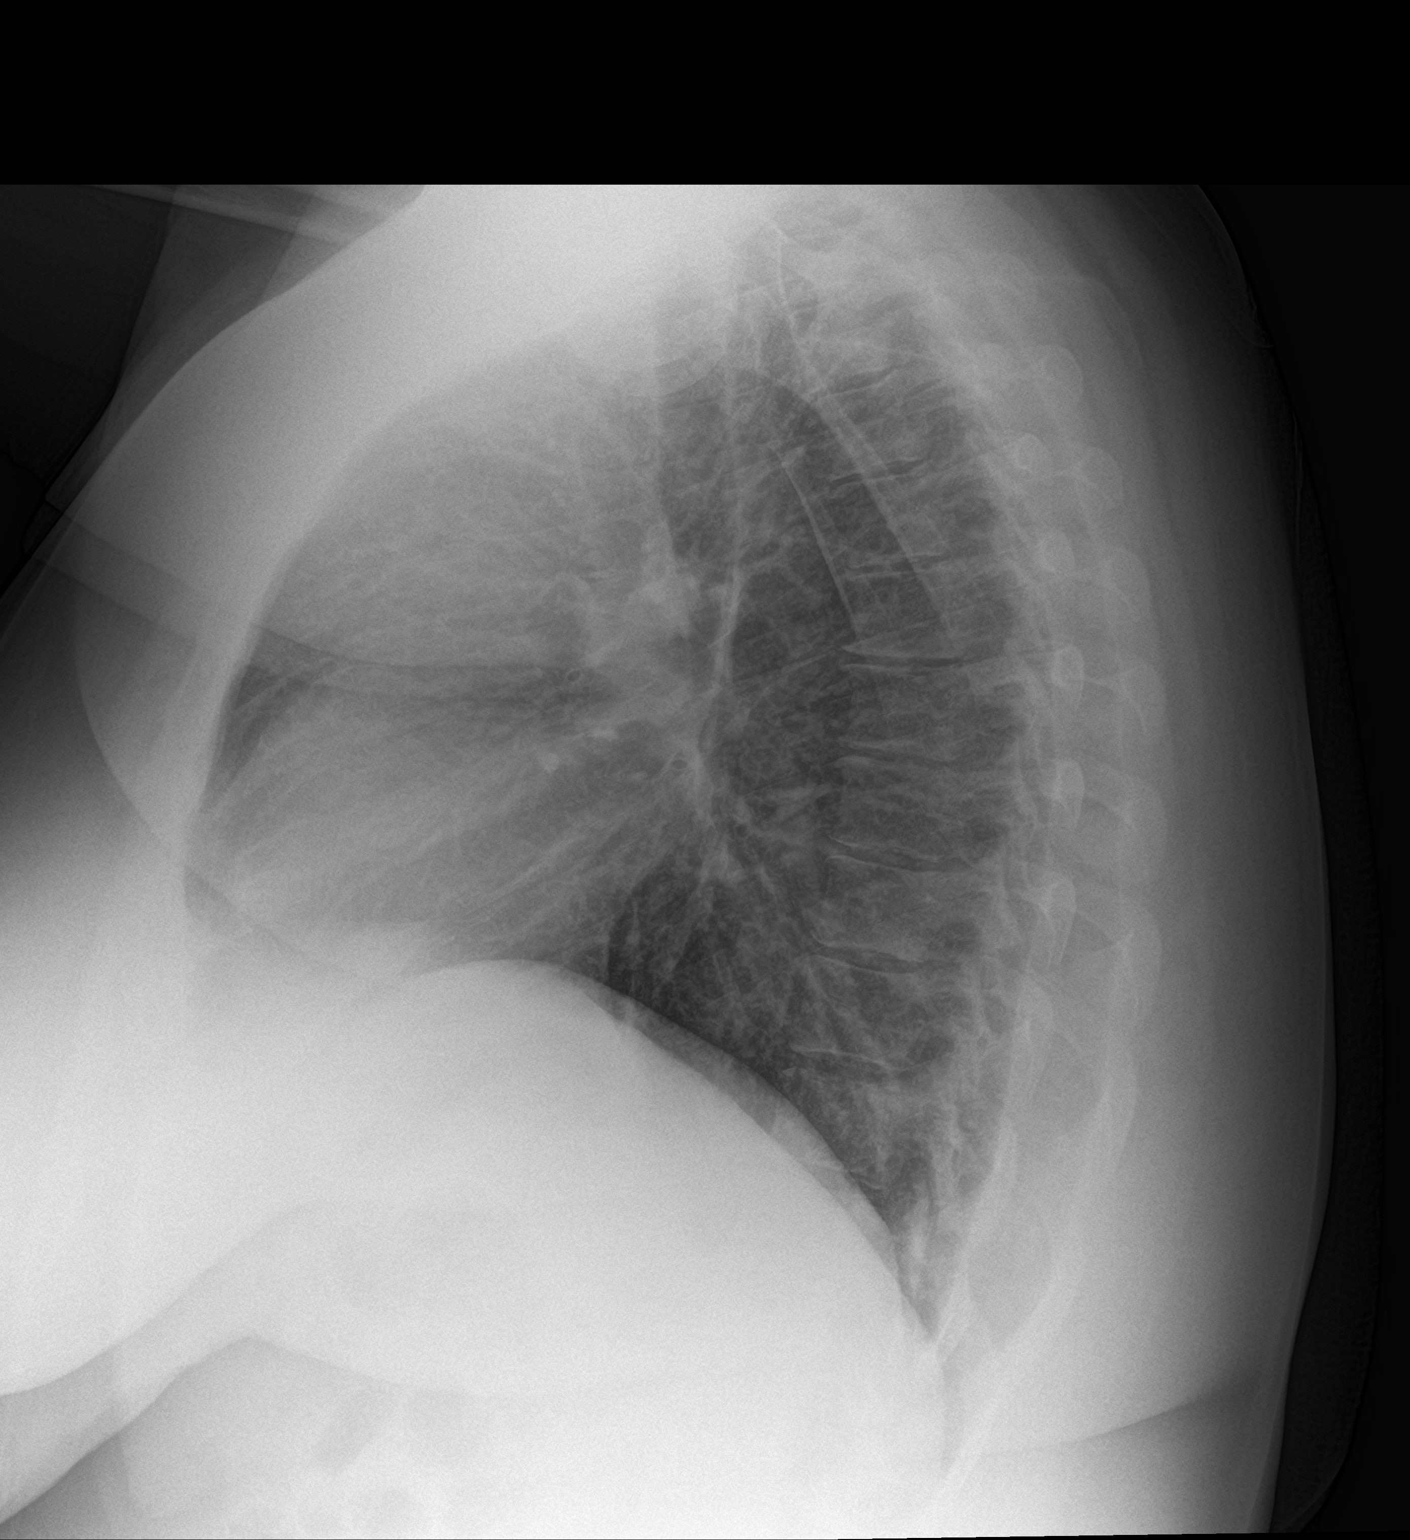

[2 of 2 positions shown; findings below may reference images not displayed]

FINDINGS: The heart size and mediastinal contours are within normal limits.
Both lungs are clear. The visualized skeletal structures are
unremarkable.
IMPRESSION: No active cardiopulmonary disease.

By: Arissa Billiot M.D.

## 2019-10-10 ENCOUNTER — Emergency Department (HOSPITAL_COMMUNITY)
Admission: EM | Admit: 2019-10-10 | Discharge: 2019-10-10 | Disposition: A | Discharge status: Home / Self Care | Payer: Medicaid - Out of State | Attending: Emergency Medicine | Admitting: Emergency Medicine

## 2019-10-10 ENCOUNTER — Encounter (HOSPITAL_COMMUNITY): Payer: Self-pay | Admitting: *Deleted

## 2019-10-10 ENCOUNTER — Other Ambulatory Visit: Payer: Self-pay

## 2019-10-10 DIAGNOSIS — Z5321 Procedure and treatment not carried out due to patient leaving prior to being seen by health care provider: Secondary | ICD-10-CM | POA: Diagnosis not present

## 2019-10-10 DIAGNOSIS — R222 Localized swelling, mass and lump, trunk: Secondary | ICD-10-CM | POA: Insufficient documentation

## 2019-10-10 NOTE — ED Triage Notes (Signed)
Pt with zit to abdomen and squeezed it and now it's getting worse. Pt got some green pus when she squeezed it.

## 2019-10-10 NOTE — ED Notes (Signed)
Pt was seen leaving but did not informed staff of leaving .

## 2020-09-13 IMAGING — CR DG CHEST 1V PORT
1 series · 1 of 1 positions shown · non-contrast
Comparison: 01/04/2018

CLINICAL DATA: Acute chest pain.

EXAM:
PORTABLE CHEST 1 VIEW

[portable]
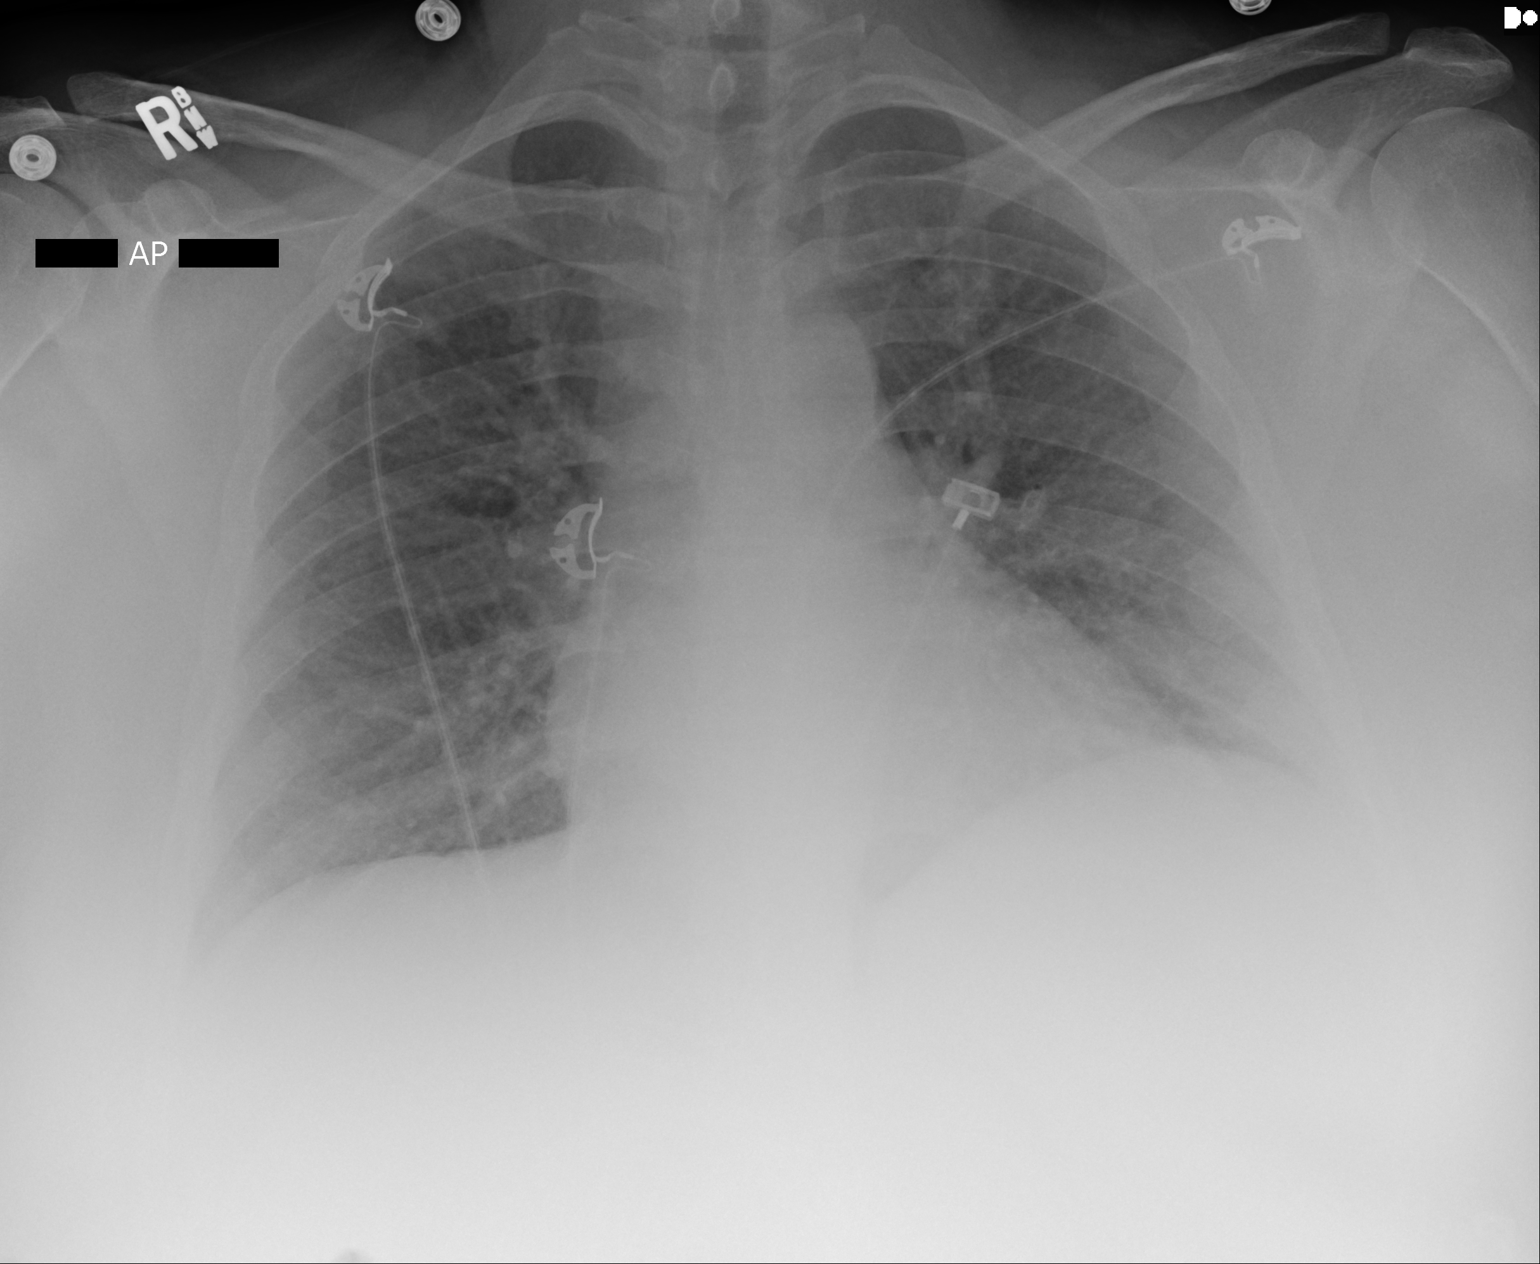

[1 of 1 positions shown; findings below may reference images not displayed]

FINDINGS: The cardiomediastinal silhouette is unremarkable.

Pulmonary vascular congestion noted.

There is no evidence of focal airspace disease, pulmonary edema,
suspicious pulmonary nodule/mass, pleural effusion, or pneumothorax.

No acute bony abnormalities are identified.
IMPRESSION: Pulmonary vascular congestion.
